# Patient Record
Sex: Female | Born: 1975 | Race: Black or African American | Hispanic: No | Marital: Married | Smoking: Former smoker
Health system: Southern US, Community
[De-identification: ages and names within clinical notes are randomized; demographics above are authoritative.]

## PROBLEM LIST (undated history)

## (undated) ENCOUNTER — Inpatient Hospital Stay (HOSPITAL_COMMUNITY): Payer: Self-pay

## (undated) DIAGNOSIS — G43909 Migraine, unspecified, not intractable, without status migrainosus: Secondary | ICD-10-CM

## (undated) DIAGNOSIS — R519 Headache, unspecified: Secondary | ICD-10-CM

## (undated) DIAGNOSIS — O351XX0 Maternal care for (suspected) chromosomal abnormality in fetus, not applicable or unspecified: Secondary | ICD-10-CM

## (undated) DIAGNOSIS — J309 Allergic rhinitis, unspecified: Secondary | ICD-10-CM

## (undated) DIAGNOSIS — R079 Chest pain, unspecified: Secondary | ICD-10-CM

## (undated) DIAGNOSIS — R51 Headache: Secondary | ICD-10-CM

## (undated) DIAGNOSIS — Z3A18 18 weeks gestation of pregnancy: Secondary | ICD-10-CM

## (undated) DIAGNOSIS — R002 Palpitations: Secondary | ICD-10-CM

## (undated) DIAGNOSIS — Z8619 Personal history of other infectious and parasitic diseases: Secondary | ICD-10-CM

## (undated) HISTORY — DX: Headache, unspecified: R51.9

## (undated) HISTORY — DX: Migraine, unspecified, not intractable, without status migrainosus: G43.909

## (undated) HISTORY — DX: Palpitations: R00.2

## (undated) HISTORY — DX: Headache: R51

## (undated) HISTORY — DX: Chest pain, unspecified: R07.9

## (undated) HISTORY — PX: BUNIONECTOMY: SHX129

## (undated) HISTORY — DX: Maternal care for (suspected) chromosomal abnormality in fetus, not applicable or unspecified: O35.1XX0

## (undated) HISTORY — DX: Allergic rhinitis, unspecified: J30.9

## (undated) HISTORY — DX: Personal history of other infectious and parasitic diseases: Z86.19

## (undated) HISTORY — PX: FACIAL RECONSTRUCTION SURGERY: SHX631

## (undated) HISTORY — DX: 18 weeks gestation of pregnancy: Z3A.18

---

## 2003-04-02 ENCOUNTER — Encounter: Admission: RE | Admit: 2003-04-02 | Discharge: 2003-04-02 | Payer: Self-pay | Admitting: Family Medicine

## 2003-04-02 ENCOUNTER — Encounter: Payer: Self-pay | Admitting: Family Medicine

## 2004-02-03 ENCOUNTER — Other Ambulatory Visit: Admission: RE | Admit: 2004-02-03 | Discharge: 2004-02-03 | Payer: Self-pay | Admitting: Family Medicine

## 2004-07-27 ENCOUNTER — Other Ambulatory Visit: Admission: RE | Admit: 2004-07-27 | Discharge: 2004-07-27 | Payer: Self-pay | Admitting: Obstetrics and Gynecology

## 2005-02-09 ENCOUNTER — Other Ambulatory Visit: Admission: RE | Admit: 2005-02-09 | Discharge: 2005-02-09 | Payer: Self-pay | Admitting: Family Medicine

## 2006-02-09 ENCOUNTER — Other Ambulatory Visit: Admission: RE | Admit: 2006-02-09 | Discharge: 2006-02-09 | Payer: Self-pay | Admitting: Family Medicine

## 2007-02-19 ENCOUNTER — Other Ambulatory Visit: Admission: RE | Admit: 2007-02-19 | Discharge: 2007-02-19 | Payer: Self-pay | Admitting: Family Medicine

## 2008-03-18 ENCOUNTER — Other Ambulatory Visit: Admission: RE | Admit: 2008-03-18 | Discharge: 2008-03-18 | Payer: Self-pay | Admitting: Family Medicine

## 2008-06-10 ENCOUNTER — Encounter: Admission: RE | Admit: 2008-06-10 | Discharge: 2008-06-10 | Payer: Self-pay | Admitting: Family Medicine

## 2008-06-10 IMAGING — CT CT HEAD W/O CM
1 series · 16 of 28 positions shown, 20 images · non-contrast
Comparison: None.

CLINICAL DATA: Headaches and nose bleeds.

CT HEAD WITHOUT CONTRAST
TECHNIQUE: Contiguous axial images were obtained from the base of
the skull through the vertex without contrast.

[Series 32: 3d filtered head · axial · 0.49mm/px · z∈[+20,+147]mm · 16 of 28 slices shown, 20 images]
[im 2/28  brain]
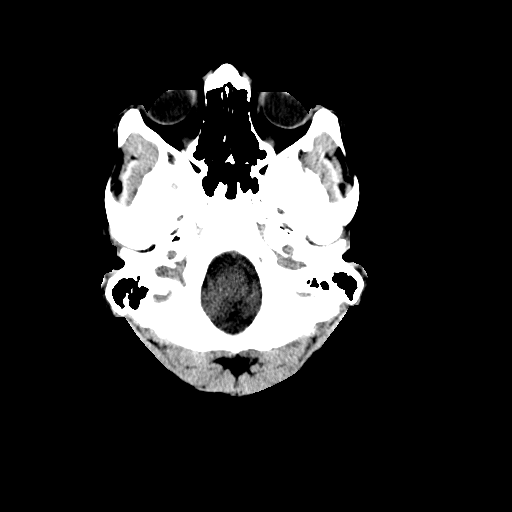
[im 2/28  bone]
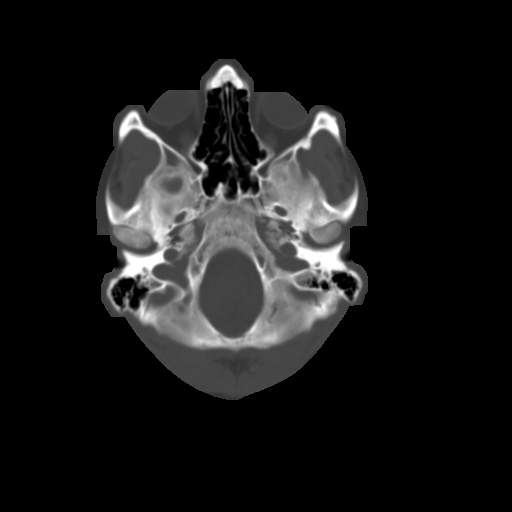
[im 4/28  brain]
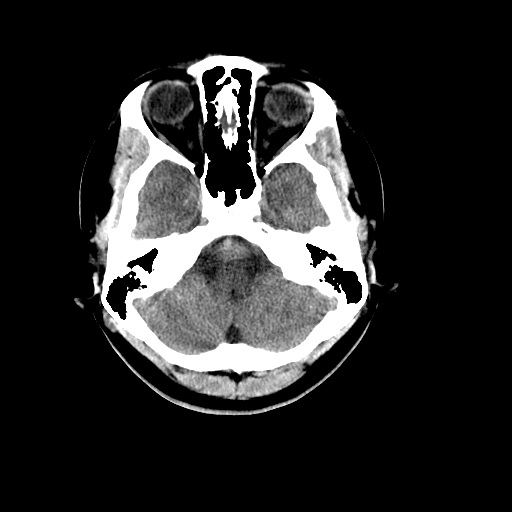
[im 6/28  brain]
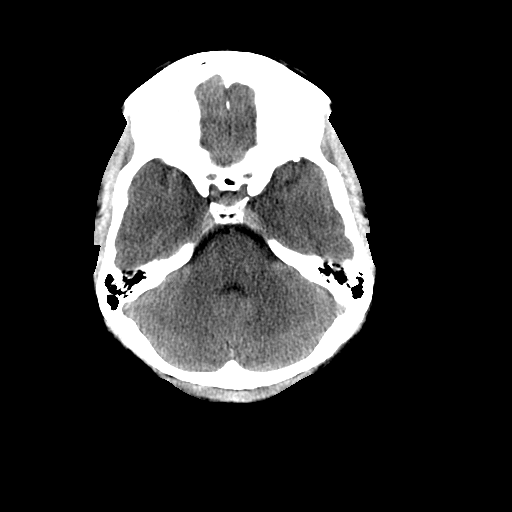
[im 7/28  brain]
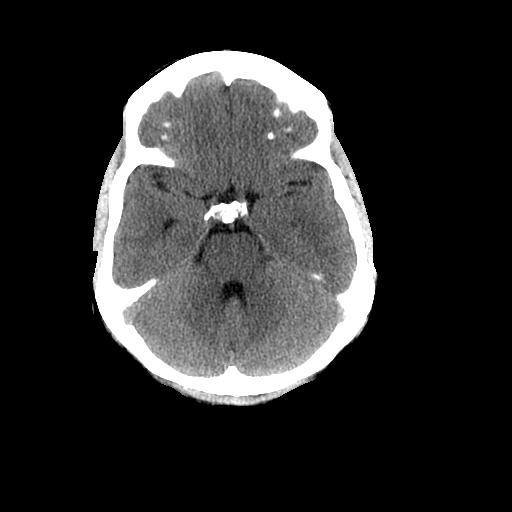
[im 9/28  brain]
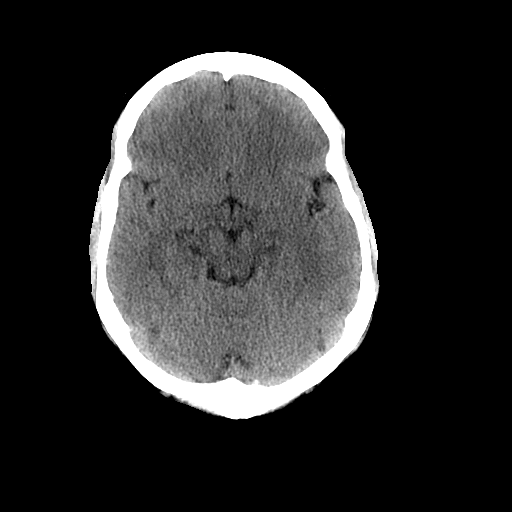
[im 9/28  bone]
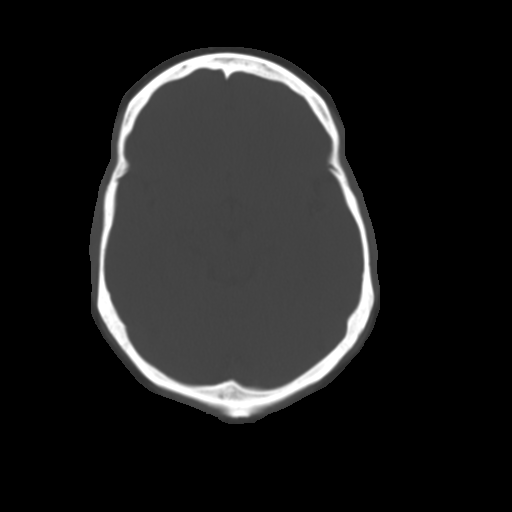
[im 10/28  brain]
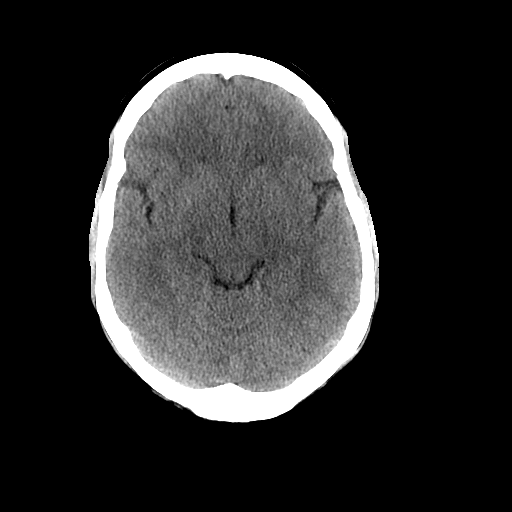
[im 12/28  brain]
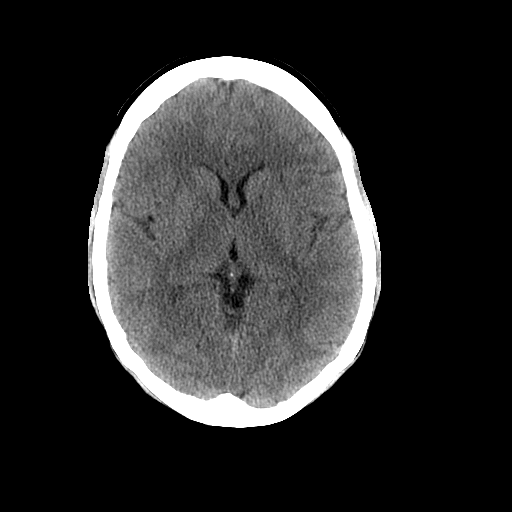
[im 14/28  brain]
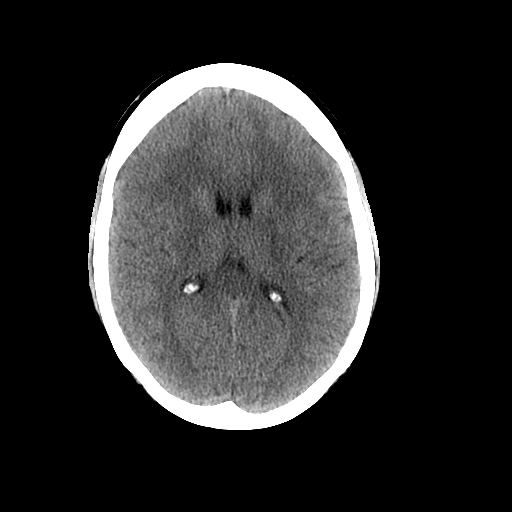
[im 15/28  brain]
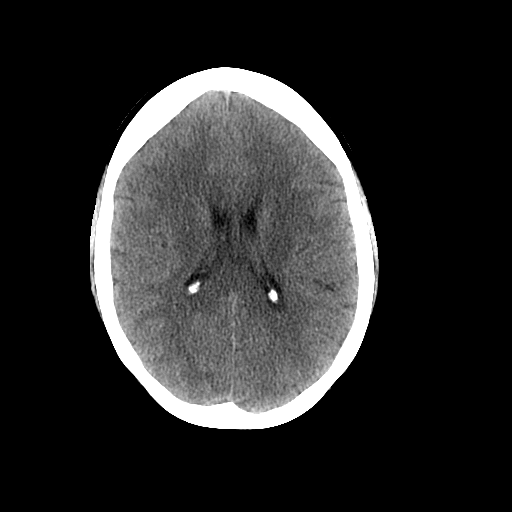
[im 15/28  bone]
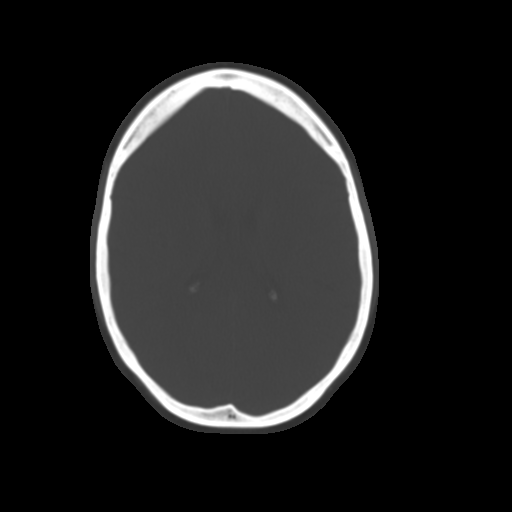
[im 17/28  brain]
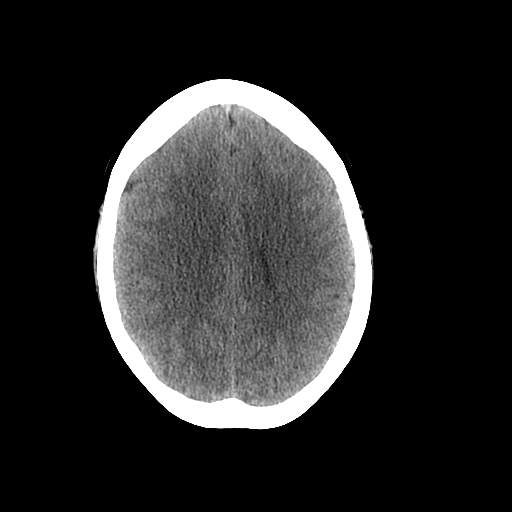
[im 19/28  brain]
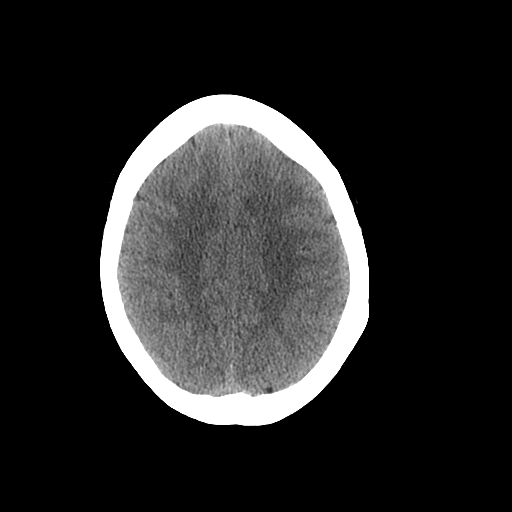
[im 20/28  brain]
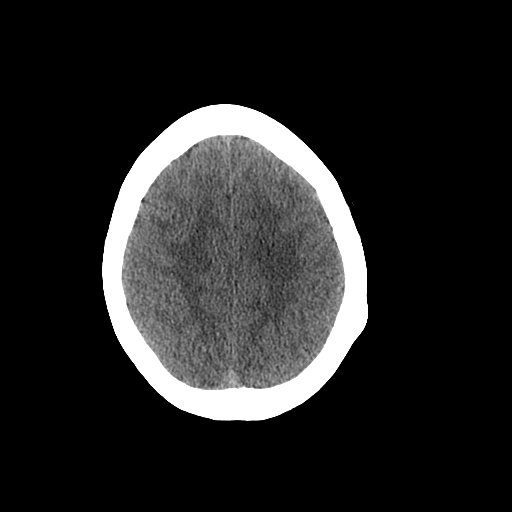
[im 22/28  brain]
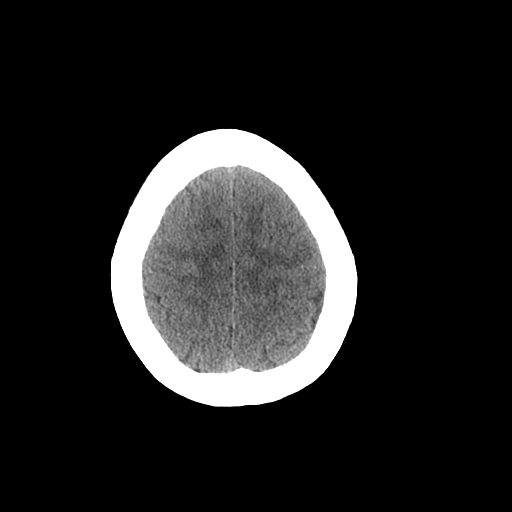
[im 22/28  bone]
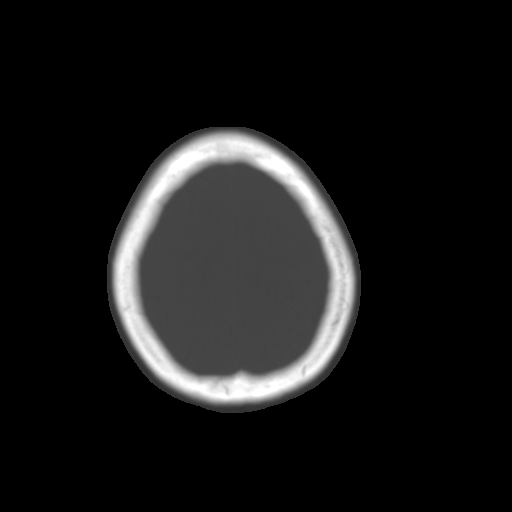
[im 23/28  brain]
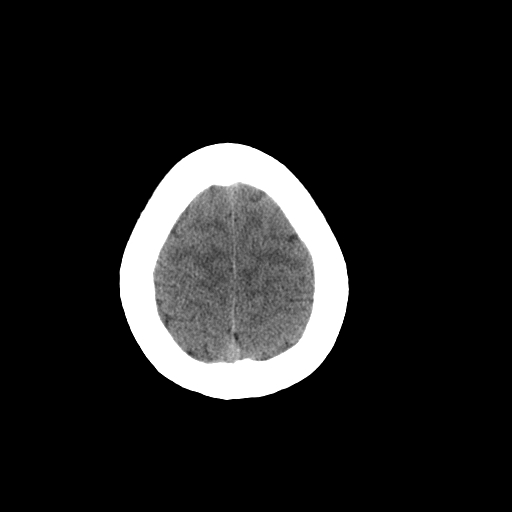
[im 25/28  brain]
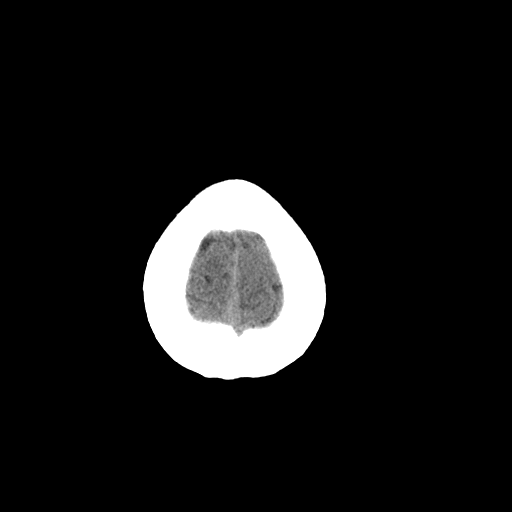
[im 27/28  brain]
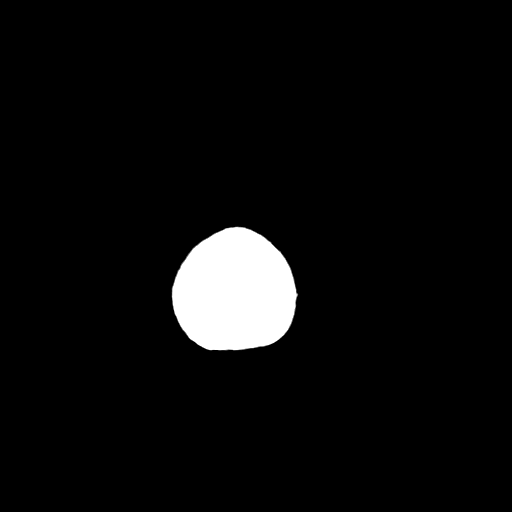

[16 of 28 positions shown; findings below may reference images not displayed]

FINDINGS: There is no evidence for acute hemorrhage, hydrocephalus,
mass lesion, or abnormal extra-axial fluid collection.  No definite
CT evidence of acute infarct.

Bone windows show the visualized portions of the paranasal sinuses
to be clear.
IMPRESSION: No acute intracranial abnormality.

## 2009-04-07 ENCOUNTER — Other Ambulatory Visit: Admission: RE | Admit: 2009-04-07 | Discharge: 2009-04-07 | Payer: Self-pay | Admitting: Family Medicine

## 2009-07-21 ENCOUNTER — Other Ambulatory Visit: Admission: RE | Admit: 2009-07-21 | Discharge: 2009-07-21 | Payer: Self-pay | Admitting: Family Medicine

## 2009-07-21 ENCOUNTER — Encounter: Admission: RE | Admit: 2009-07-21 | Discharge: 2009-07-21 | Payer: Self-pay | Admitting: Diagnostic Neuroimaging

## 2009-07-21 IMAGING — CT CT ANGIO HEAD
4 of 9 series · 18 of 47 positions shown · IV contrast ([ID] OMNI 350)
Comparison: [DATE].

CLINICAL DATA: Right occipital headaches.  Dizziness and nausea.
History migraines.  Occipital neuralgia.  Left-sided ptosis.  Right
arm parasthesias.

CT ANGIOGRAPHY HEAD
TECHNIQUE: Multidetector CT imaging of the head was performed
using the standard protocol during bolus administration of
intravenous contrast. Multiplanar CT image reconstructions
including MIPs were obtained to evaluate the vascular anatomy.
Contrast: 100 ml Omnipaque 350

[Series 6: cta head · axial · 0.35mm/px · z∈[-0,+87]mm · 4 of 59 slices shown]
[im 12/59  brain]
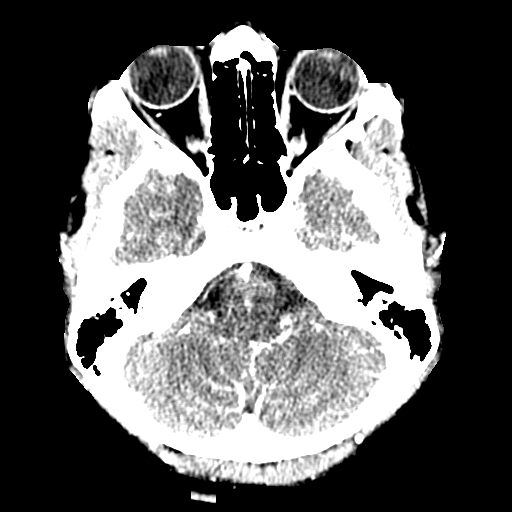
[im 24/59  bone]
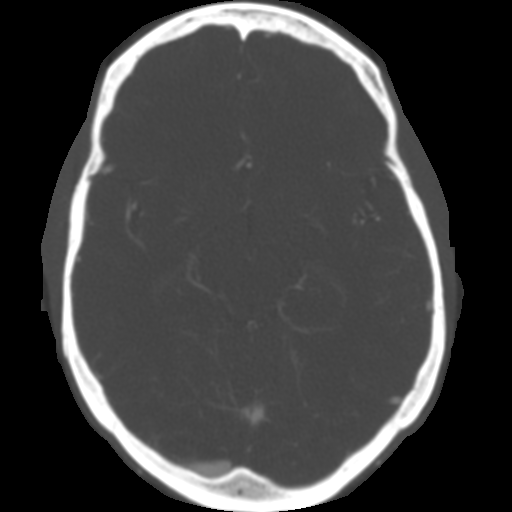
[im 35/59  brain]
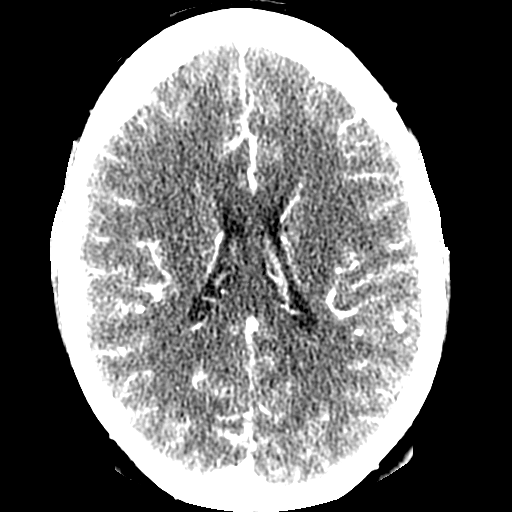
[im 47/59  bone]
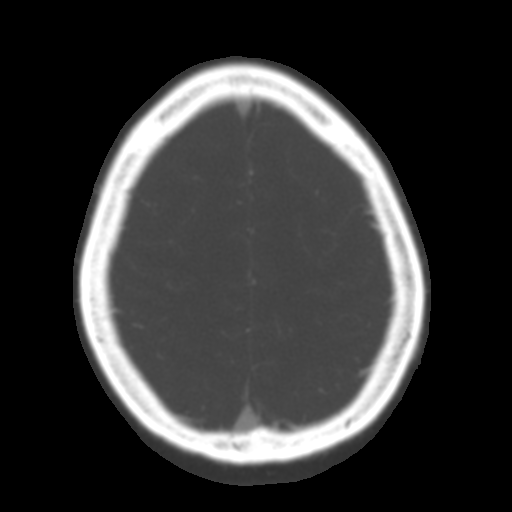

[Series 500: cor angio · coronal · 0.35mm/px · 3 of 167 slices shown]
[im 48/167  brain]
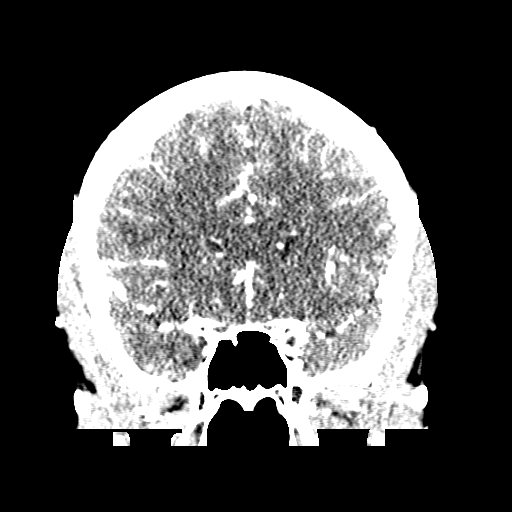
[im 72/167  brain]
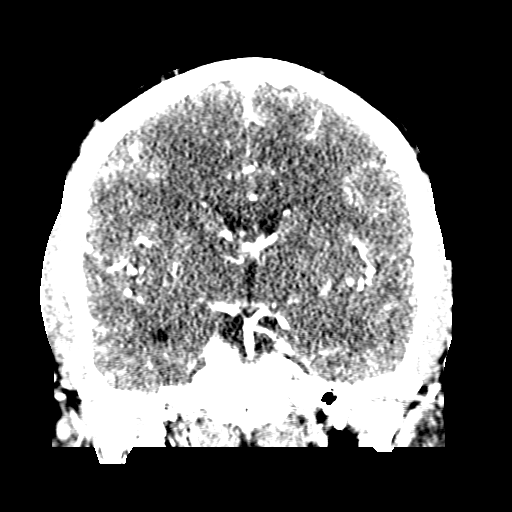
[im 95/167  brain]
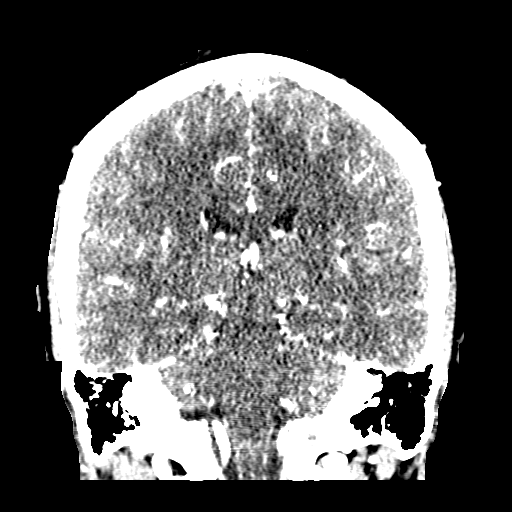

[Series 503: sag · sagittal · 0.35mm/px · 3 of 142 slices shown]
[im 29/142  brain]
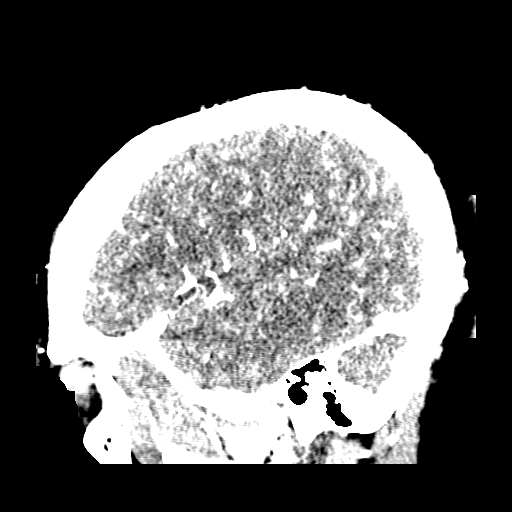
[im 57/142  brain]
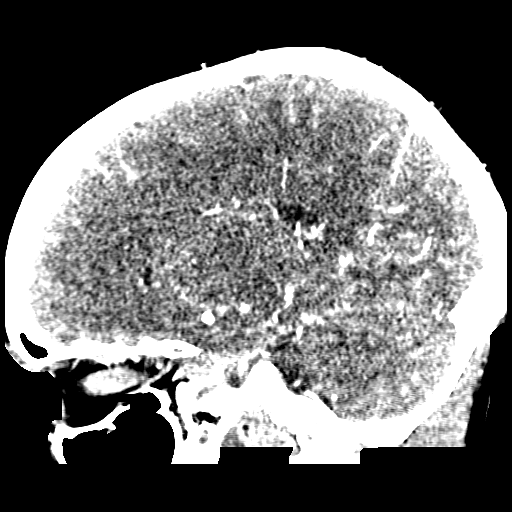
[im 85/142  brain]
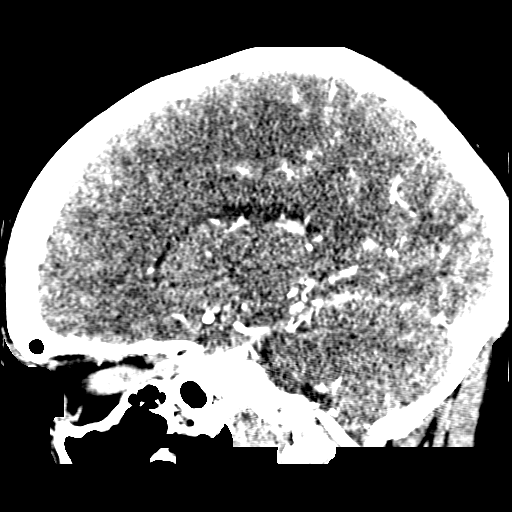

[Series 504: axial · axial · 0.35mm/px · z∈[+41,+146]mm · 8 of 146 slices shown]
[im 11/146  brain]
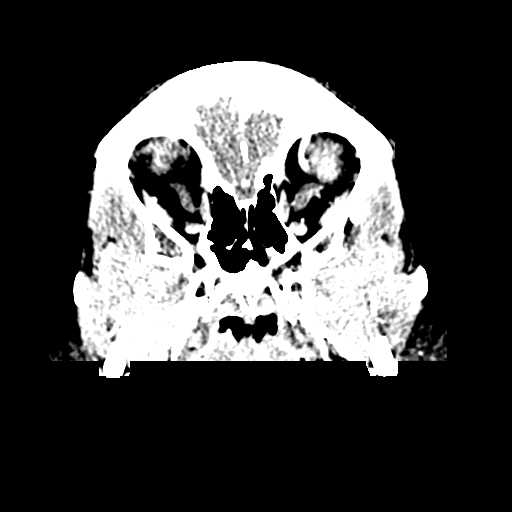
[im 32/146  brain]
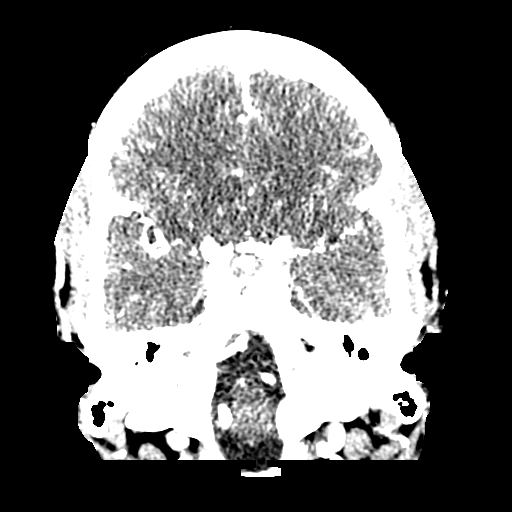
[im 52/146  brain]
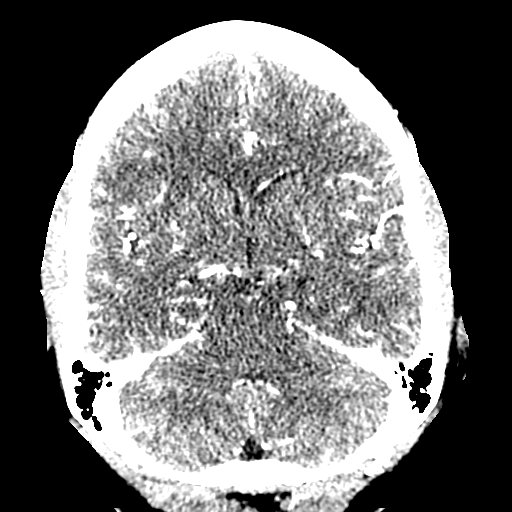
[im 63/146  brain]
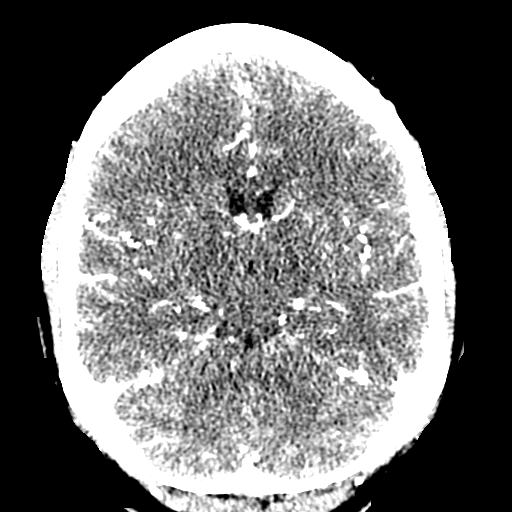
[im 83/146  brain]
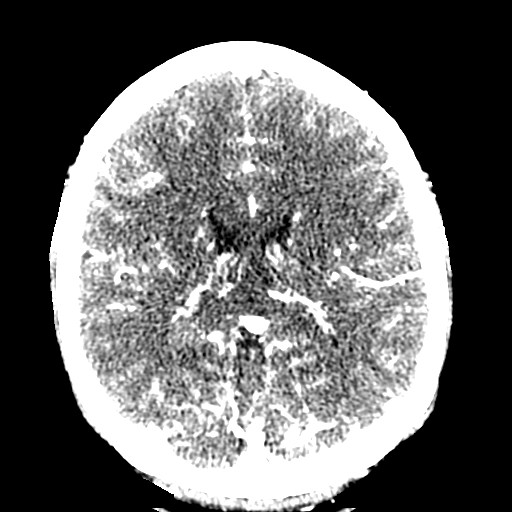
[im 94/146  brain]
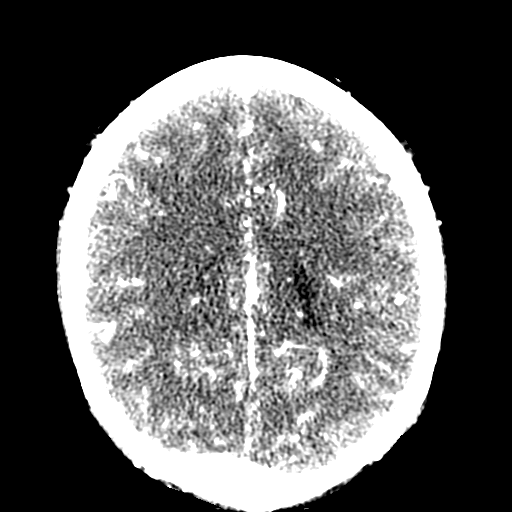
[im 114/146  brain]
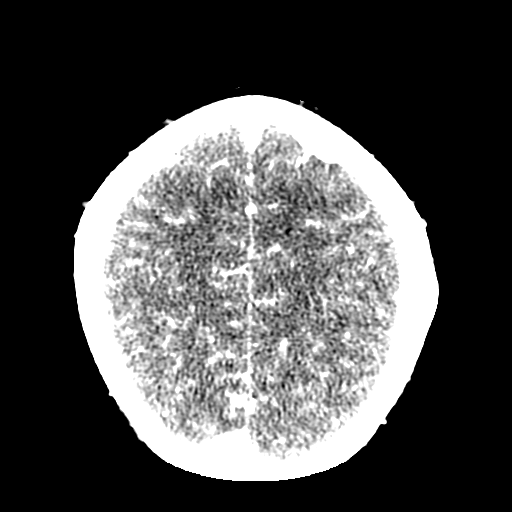
[im 135/146  brain]
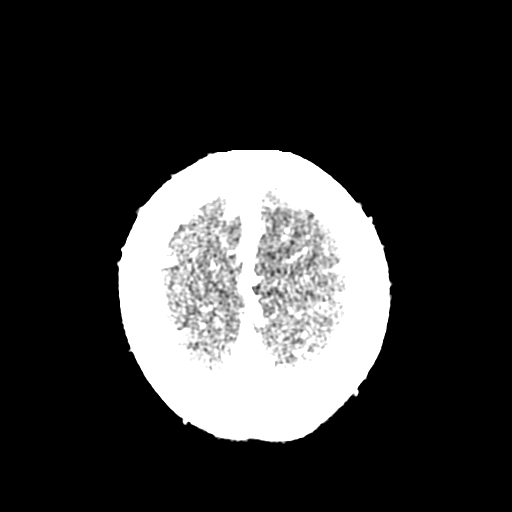

[18 of 47 positions shown; findings below may reference images not displayed]

FINDINGS: Noncontrast imaging of the brain is unremarkable.  There
is no evidence for acute infarct, hemorrhage, mass, hydrocephalus,
or significant extra-axial fluid collection.

The postcontrast images demonstrate no areas pathologic
enhancement.

The paranasal sinuses and mastoid air cells are clear.  The osseous
skull is intact.  The patient is status post ORIF of the left
lateral orbital rim.

CTA images demonstrate normal appearance of the high cervical
internal carotid artery as well as the internal carotid arteries
through the skull base bilaterally.  The A1 and M1 segments are
normal.  The anterior communicating artery is patent.  The MCA
branch vessels are within normal limits bilaterally.

The vertebral arteries are codominant.  The basilar arteries within
normal limits.  Both posterior cerebral arteries originate from the
basilar tip.  Prominent posterior communicating arteries are seen
bilaterally, more prominent on the right.

The dural sinuses are patent.

 Review of the MIP images confirms the above findings.
IMPRESSION: 1.  Normal variant CTA circle of Willis without significant
proximal stenosis, aneurysm, or branch vessel occlusion.
2.  No acute intracranial abnormality.

## 2010-04-08 ENCOUNTER — Other Ambulatory Visit: Admission: RE | Admit: 2010-04-08 | Discharge: 2010-04-08 | Payer: Self-pay | Admitting: Family Medicine

## 2011-04-13 ENCOUNTER — Other Ambulatory Visit (HOSPITAL_COMMUNITY)
Admission: RE | Admit: 2011-04-13 | Discharge: 2011-04-13 | Disposition: A | Discharge status: Home / Self Care | Payer: BC Managed Care – PPO | Source: Ambulatory Visit | Attending: Family Medicine | Admitting: Family Medicine

## 2011-04-13 ENCOUNTER — Other Ambulatory Visit: Payer: Self-pay | Admitting: Family Medicine

## 2011-04-13 DIAGNOSIS — Z124 Encounter for screening for malignant neoplasm of cervix: Secondary | ICD-10-CM | POA: Insufficient documentation

## 2014-06-25 ENCOUNTER — Other Ambulatory Visit: Payer: Self-pay | Admitting: Family Medicine

## 2014-06-25 ENCOUNTER — Other Ambulatory Visit (HOSPITAL_COMMUNITY)
Admission: RE | Admit: 2014-06-25 | Discharge: 2014-06-25 | Disposition: A | Discharge status: Home / Self Care | Payer: BC Managed Care – PPO | Source: Ambulatory Visit | Attending: Family Medicine | Admitting: Family Medicine

## 2014-06-25 DIAGNOSIS — Z124 Encounter for screening for malignant neoplasm of cervix: Secondary | ICD-10-CM | POA: Insufficient documentation

## 2014-06-26 LAB — CYTOLOGY - PAP

## 2015-12-01 ENCOUNTER — Other Ambulatory Visit (HOSPITAL_COMMUNITY): Payer: Self-pay | Admitting: Obstetrics & Gynecology

## 2015-12-01 DIAGNOSIS — Z3189 Encounter for other procreative management: Secondary | ICD-10-CM

## 2015-12-02 DIAGNOSIS — Z3189 Encounter for other procreative management: Secondary | ICD-10-CM | POA: Diagnosis not present

## 2015-12-10 ENCOUNTER — Ambulatory Visit (HOSPITAL_COMMUNITY)
Admission: RE | Admit: 2015-12-10 | Discharge: 2015-12-10 | Disposition: A | Discharge status: Home / Self Care | Payer: BLUE CROSS/BLUE SHIELD | Source: Ambulatory Visit | Attending: Obstetrics & Gynecology | Admitting: Obstetrics & Gynecology

## 2015-12-10 DIAGNOSIS — Z3141 Encounter for fertility testing: Secondary | ICD-10-CM | POA: Diagnosis not present

## 2015-12-10 DIAGNOSIS — Z3189 Encounter for other procreative management: Secondary | ICD-10-CM | POA: Insufficient documentation

## 2015-12-10 IMAGING — RF DG HYSTEROGRAM
2 series · 2 of 2 positions shown · IV contrast (omnipaque)
Comparison: None

CLINICAL DATA: Encounter for fertility planning

EXAM:
HYSTEROSALPINGOGRAM
TECHNIQUE: Following cleansing of the cervix and vagina with Betadine solution,
a hysterosalpingogram was performed using a 5-French
hysterosalpingogram catheter and Omnipaque 300 contrast. The patient
tolerated the examination without difficulty.

[Series 1: run · 1 of 1 slices shown (1 of 2)]
[im 1/1]
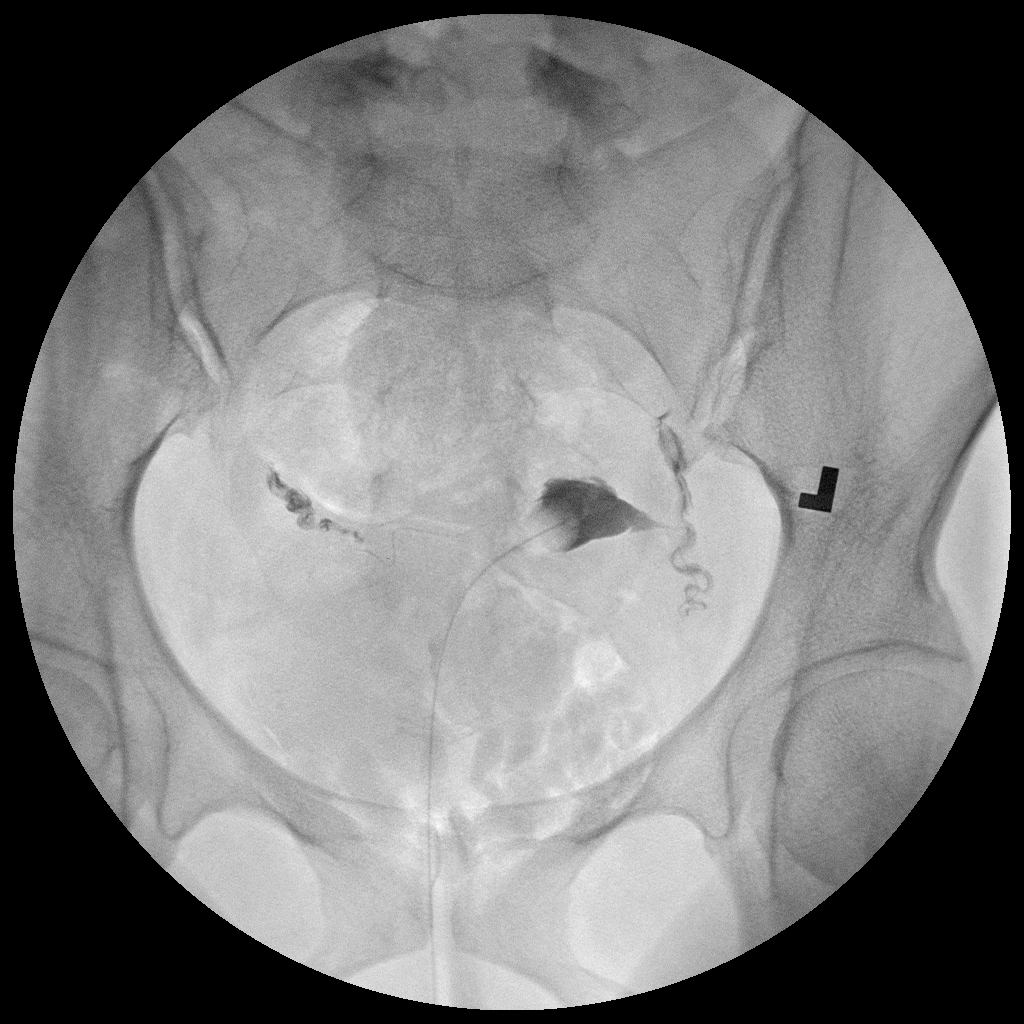

[Series 2: run · 1 of 1 slices shown (2 of 2)]
[im 1/1]
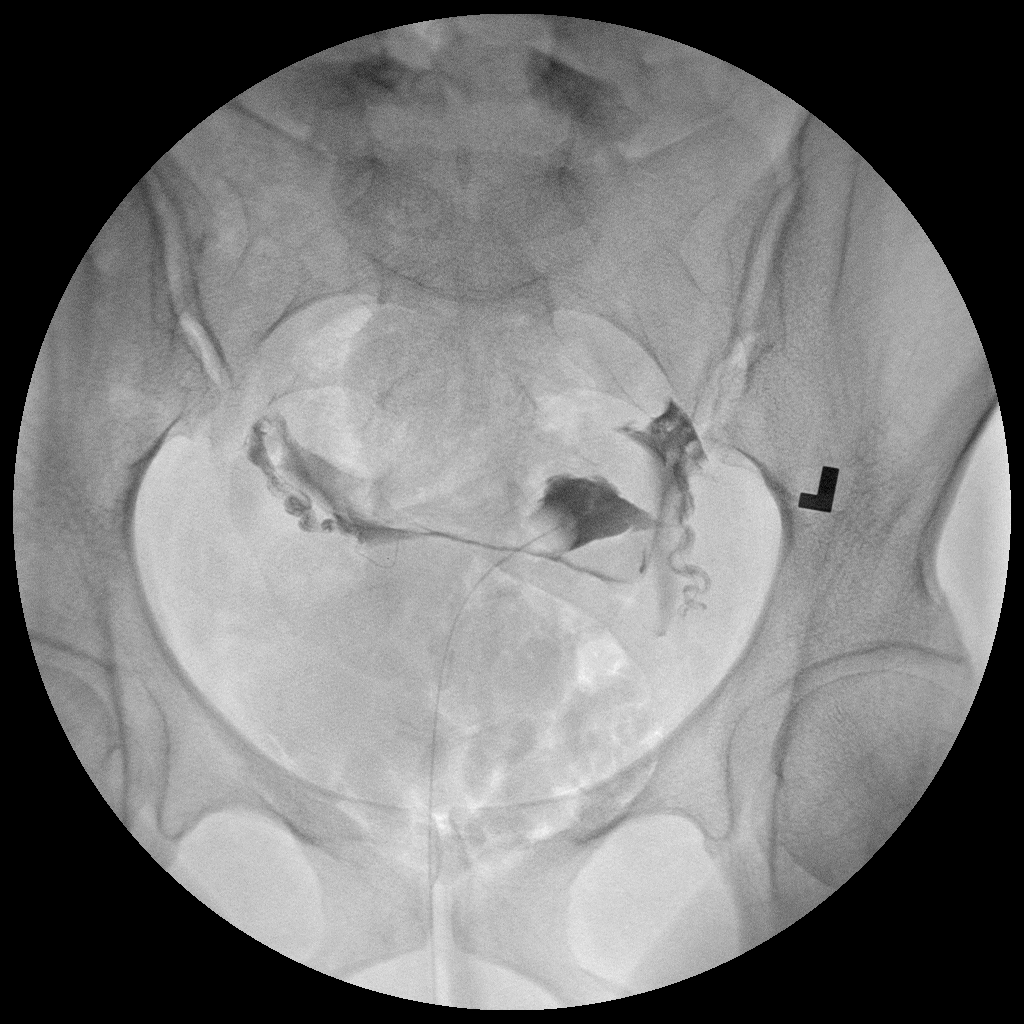

[2 of 2 positions shown; findings below may reference images not displayed]

FLUOROSCOPY TIME:  Radiation Exposure Index (as provided by the
fluoroscopic device):

If the device does not provide the exposure index:

Fluoroscopy Time:  12 seconds

Number of Acquired Images:  2
FINDINGS: The endometrial cavity is normal in appearance and contour. No signs
of mullerian duct anomaly.

Opacification of both fallopian tubes is seen. Both tubes appear
normal. Intraperitoneal spill of contrast from both fallopian tubes
is demonstrated.
IMPRESSION: Normal study. Both fallopian tubes are patent.

## 2015-12-10 MED ORDER — IOPAMIDOL (ISOVUE-300) INJECTION 61%
30.0000 mL | Freq: Once | INTRAVENOUS | Status: AC | PRN
  Administered 2015-12-10: 3 mL

## 2016-01-18 DIAGNOSIS — H524 Presbyopia: Secondary | ICD-10-CM | POA: Diagnosis not present

## 2016-01-18 DIAGNOSIS — H52223 Regular astigmatism, bilateral: Secondary | ICD-10-CM | POA: Diagnosis not present

## 2016-01-18 DIAGNOSIS — H5213 Myopia, bilateral: Secondary | ICD-10-CM | POA: Diagnosis not present

## 2016-04-21 ENCOUNTER — Telehealth: Payer: Self-pay | Admitting: Cardiology

## 2016-04-21 ENCOUNTER — Encounter: Payer: Self-pay | Admitting: Nurse Practitioner

## 2016-04-21 NOTE — Telephone Encounter (Signed)
Closed encounter °

## 2016-04-25 ENCOUNTER — Ambulatory Visit: Payer: BLUE CROSS/BLUE SHIELD | Admitting: Nurse Practitioner

## 2016-04-25 ENCOUNTER — Telehealth: Payer: Self-pay | Admitting: Cardiology

## 2016-04-25 NOTE — Telephone Encounter (Signed)
Received records from [facility] for appointment on 05/05/16 with Dr Herbie BaltimoreHarding.  [facility] Dr Elissa HeftyHarding's schedule on 05/05/16. lp

## 2016-05-05 ENCOUNTER — Encounter: Payer: Self-pay | Admitting: Cardiology

## 2016-05-05 ENCOUNTER — Encounter (INDEPENDENT_AMBULATORY_CARE_PROVIDER_SITE_OTHER): Payer: BLUE CROSS/BLUE SHIELD

## 2016-05-05 ENCOUNTER — Ambulatory Visit (INDEPENDENT_AMBULATORY_CARE_PROVIDER_SITE_OTHER): Payer: BLUE CROSS/BLUE SHIELD | Admitting: Cardiology

## 2016-05-05 VITALS — BP 106/88 | HR 99 | Ht 63.0 in | Wt 158.0 lb

## 2016-05-05 DIAGNOSIS — R002 Palpitations: Secondary | ICD-10-CM

## 2016-05-05 DIAGNOSIS — R079 Chest pain, unspecified: Secondary | ICD-10-CM

## 2016-05-05 NOTE — Progress Notes (Signed)
PCP: Hollice Espy, MD  Clinic Note: Chief Complaint  Patient presents with  . Follow-up    New patient.  . Chest Pain    Tightness  . Edema    Ankles when standing for lond periods of time.    HPI: Stephanie Watts is a 40 y.o. female with a PMH below who presents today for Evaluation of chest discomfort and palpitations.Stephanie Watts was referred by her primary care provider, Dr. Shaune Pollack. She was last seen in February 2017 and was noticing having intermittent episodes of fleeting pinching sensation and left side of her chest. At that time this was not thought to be anything significant, but since the symptoms have persisted, she is now referred for evaluation. She really has no significant medical history other than migraine headaches and allergy rhinitis. As per family history her paternal grandfather may have had CHF and both parents had hypertension. Otherwise no significant CAD history.  Recent Hospitalizations: None  Studies Reviewed: None  Interval History: [name] is a otherwise healthy young woman who is here today with her husband (a Optometrist).  She tries to stay active and is working on trying to become more consistent with her gym activities. She is low to concerned about this chest discomfort episodes in soap and to have some peace of mind. Basically what she describes are episodes lasting anywhere from a minute or 2 up to 15 minutes of just an unusual pinching sensation in the left upper chest just lateral to the sternal border. It's somewhat focal in nature, and really does not have a true affiliation with any particular activity. He can happen when she is sitting or standing, tends to not be as much when she is doing her activities, But it can be with exercise. She doesn't know if it feels like a flip-flopping in her chest or if it's a pinching sensation. It is not described as a pressure or tightness however. She doesn't notice any shortness of  breath associated with it, but does occasionally say she may feel limited dizzy. Usually the dizziness happens more frequently when she does her exercise without having either had enough to eat or drink. Despite having these episodes, she's not had any exertional dyspnea. No heart failure symptoms of PND, orthopnea or edema. No rapid irregular heartbeat sensations to suggest an arrhythmia.No syncope or near-syncope, TIA or amaurosis fugax symptoms.  ROS: A comprehensive was performed. Review of Systems  Constitutional: Negative for chills and malaise/fatigue.  HENT: Negative for nosebleeds.   Eyes: Negative for blurred vision.  Respiratory: Negative for cough, shortness of breath and wheezing.   Cardiovascular: Positive for chest pain and palpitations.  Gastrointestinal: Negative for blood in stool and melena.  Genitourinary: Negative for hematuria.  Musculoskeletal: Negative.   Neurological: Positive for dizziness (Per history of present illness). Negative for tingling, focal weakness, loss of consciousness and headaches.  Endo/Heme/Allergies: Positive for environmental allergies.  Psychiatric/Behavioral: Negative for depression and memory loss. The patient is not nervous/anxious and does not have insomnia.     Past Medical History:  Diagnosis Date  . Allergic rhinitis   . Hx of herpes genitalis   . Migraine headache   . Occipital headache     Past Surgical History:  Procedure Laterality Date  . NO PAST SURGERIES     Prior to Admission medications   Medication Sig Start Date End Date Taking? Authorizing Provider  Multiple Vitamins-Minerals (CENTRUM PO) Take by mouth daily.  Yes Historical Provider, MD  Prenatal Multivit-Min-Fe-FA (PRENATAL VITAMINS PO) Take 1 tablet by mouth daily.   Yes Historical Provider, MD  valACYclovir (VALTREX) 500 MG tablet Take 500 mg by mouth as needed. 04/18/16  Yes Historical Provider, MD   No Known Allergies  Social History   Social History  .  Marital status: Married    Spouse name: N/A  . Number of children: N/A  . Years of education: N/A   Social History Main Topics  . Smoking status: Former Smoker    Types: Cigarettes  . Smokeless tobacco: Never Used     Comment: every 6 mths or so  . Alcohol use None  . Drug use: No  . Sexual activity: Yes   Other Topics Concern  . None   Social History Narrative  . None    Family History  Problem Relation Age of Onset  . Hypertension Mother   . High Cholesterol Father   . Hypertension Father   . Healthy Sister   . Healthy Brother   . Congestive Heart Failure Paternal Grandfather     Wt Readings from Last 3 Encounters:  05/05/16 158 lb (71.7 kg)    PHYSICAL EXAM BP 106/88   Pulse 99   Ht 5\' 3"  (1.6 m)   Wt 158 lb (71.7 kg)   BMI 27.99 kg/m  General appearance: alert, cooperative, appears stated age, no distress and Well-nourished/well-groomed HEENT: [place]/AT, EOMI, MMM, anicteric sclera Neck: no adenopathy, no carotid bruit and no JVD Lungs: clear to auscultation bilaterally, normal percussion bilaterally and non-labored Heart: regular rate and rhythm, S1 & S2 normal, no murmur, click, rub or gallop; nondisplaced PMI. Unable to reproduce her symptoms, but able to locate with one finger. Abdomen: soft, non-tender; bowel sounds normal; no masses,  no organomegaly;  Extremities: extremities normal, atraumatic, no cyanosis, Or edema  Pulses: 2+ and symmetric;  Skin: mobility and turgor normal, no edema, no evidence of bleeding or bruising and no lesions noted Neurologic: Mental status: Alert, oriented, thought content appropriate Cranial nerves: normal (II-XII grossly intact)    Adult ECG Report  Rate: 99 ;  Rhythm: normal sinus rhythm and Normal axis, intervals and durations.;   Narrative Interpretation: Normal EKG   Other studies Reviewed: Additional studies/ records that were reviewed today include:  Recent Labs:  None available     ASSESSMENT /  PLAN: Problem List Items Addressed This Visit    Palpitations    She can't tell if these episodes are palpitations or discomfort. I heard some ectopy on exam, and 1 she may be having some episodes of bigeminy or some other type of palpitations that could be causing her discomfort in her chest. Plan: Short Term Event Monitor      Relevant Orders   EKG 12-Lead   EXERCISE TOLERANCE TEST   Cardiac event monitor   Chest pain with low risk for cardiac etiology - Primary    Focal pain unless the chest dated does not seem to be classically anginal symptom. She really does not have much the way of any significant risk factors, but is somewhat concerned about getting more involved in an excised regimen. Plan: GXT      Relevant Orders   EKG 12-Lead   EXERCISE TOLERANCE TEST   Cardiac event monitor    Other Visit Diagnoses   None.     Current medicines are reviewed at length with the patient today. (+/- concerns) None The following changes have been made: None Studies Ordered:  Orders Placed This Encounter  Procedures  . EXERCISE TOLERANCE TEST  . Cardiac event monitor  . EKG 12-Lead   Follow-up in one month   Bryan Lemmaavid Harding, M.D., M.S. Interventional Cardiologist   Pager # [phone] Phone # [phone] [address] [place], [place] [address]

## 2016-05-05 NOTE — Patient Instructions (Signed)
SCHEDULE [address] Your physician has requested that you have an exercise tolerance test. For further information please visit [email or web address] Please also follow instruction sheet, as given.   Your physician has recommended that you wear an event monitor FOR 1 WEEK. Event monitors are medical devices that record the heart's electrical activity. Doctors most often us these monitors to diagnose arrhythmias. Arrhythmias are problems with the speed or rhythm of the heartbeat. The monitor is a small, portable device. You can wear one while you do your normal daily activities. This is usually used to diagnose what is causing palpitations/syncope (passing out).   Your physician recommends that you schedule a follow-up appointment in: 1 MONTH WITH DR HARDING.

## 2016-05-06 ENCOUNTER — Encounter: Payer: Self-pay | Admitting: Cardiology

## 2016-05-06 DIAGNOSIS — R002 Palpitations: Secondary | ICD-10-CM | POA: Insufficient documentation

## 2016-05-06 DIAGNOSIS — R071 Chest pain on breathing: Secondary | ICD-10-CM

## 2016-05-06 DIAGNOSIS — R079 Chest pain, unspecified: Secondary | ICD-10-CM

## 2016-05-06 DIAGNOSIS — R0789 Other chest pain: Secondary | ICD-10-CM | POA: Insufficient documentation

## 2016-05-06 HISTORY — DX: Chest pain, unspecified: R07.9

## 2016-05-06 HISTORY — DX: Palpitations: R00.2

## 2016-05-06 NOTE — Assessment & Plan Note (Signed)
She can't tell if these episodes are palpitations or discomfort. I heard some ectopy on exam, and 1 she may be having some episodes of bigeminy or some other type of palpitations that could be causing her discomfort in her chest. Plan: Short Term Event Monitor

## 2016-05-06 NOTE — Assessment & Plan Note (Signed)
Focal pain unless the chest dated does not seem to be classically anginal symptom. She really does not have much the way of any significant risk factors, but is somewhat concerned about getting more involved in an excised regimen. Plan: GXT

## 2016-05-09 DIAGNOSIS — Z1231 Encounter for screening mammogram for malignant neoplasm of breast: Secondary | ICD-10-CM | POA: Diagnosis not present

## 2016-05-09 DIAGNOSIS — Z319 Encounter for procreative management, unspecified: Secondary | ICD-10-CM | POA: Diagnosis not present

## 2016-05-09 DIAGNOSIS — Z31430 Encounter of female for testing for genetic disease carrier status for procreative management: Secondary | ICD-10-CM | POA: Diagnosis not present

## 2016-05-11 DIAGNOSIS — R002 Palpitations: Secondary | ICD-10-CM

## 2016-05-12 ENCOUNTER — Telehealth (HOSPITAL_COMMUNITY): Payer: Self-pay

## 2016-05-12 NOTE — Telephone Encounter (Signed)
Encounter complete. 

## 2016-05-17 ENCOUNTER — Ambulatory Visit (HOSPITAL_COMMUNITY)
Admission: RE | Admit: 2016-05-17 | Discharge: 2016-05-17 | Disposition: A | Discharge status: Home / Self Care | Payer: BLUE CROSS/BLUE SHIELD | Source: Ambulatory Visit | Attending: Nurse Practitioner | Admitting: Nurse Practitioner

## 2016-05-17 DIAGNOSIS — R079 Chest pain, unspecified: Secondary | ICD-10-CM | POA: Insufficient documentation

## 2016-05-17 DIAGNOSIS — R002 Palpitations: Secondary | ICD-10-CM | POA: Diagnosis not present

## 2016-05-17 LAB — EXERCISE TOLERANCE TEST
Estimated workload: 10.1 METS
Exercise duration (min): 9 min
Exercise duration (sec): 0 s
MPHR: 180 {beats}/min
Peak HR: 181 {beats}/min
Percent HR: 100 %
RPE: 17
Rest HR: 92 {beats}/min

## 2016-05-30 NOTE — Addendum Note (Signed)
Addended by: Neta EhlersRUITT, ANGELA M on: 05/30/2016 02:40 PM   Modules accepted: Orders

## 2016-06-06 ENCOUNTER — Ambulatory Visit (INDEPENDENT_AMBULATORY_CARE_PROVIDER_SITE_OTHER): Payer: BLUE CROSS/BLUE SHIELD | Admitting: Cardiology

## 2016-06-06 ENCOUNTER — Encounter: Payer: Self-pay | Admitting: Cardiology

## 2016-06-06 VITALS — BP 110/78 | HR 92 | Ht 63.0 in | Wt 160.0 lb

## 2016-06-06 DIAGNOSIS — R002 Palpitations: Secondary | ICD-10-CM | POA: Diagnosis not present

## 2016-06-06 DIAGNOSIS — R079 Chest pain, unspecified: Secondary | ICD-10-CM | POA: Diagnosis not present

## 2016-06-06 NOTE — Progress Notes (Signed)
PCP: Hollice Espy, MD  Clinic Note: Chief Complaint  Patient presents with  . Follow-up    chest pain evaluation - Monitor & GXT  . Dizziness    LAST THURSDAY    HPI: Stephanie Watts is a 40 y.o. female with a PMH below who presents today for follow-up Evaluation of chest discomfort and palpitations.Stephanie Watts was referred by her primary care provider, Dr. Shaune Pollack for intermittent episodes of fleeting pinching sensation and left side of her chest. . She really has no significant medical history other than migraine headaches and allergy rhinitis. As per family history her paternal grandfather may have had CHF and both parents had hypertension. Otherwise no significant CAD history. Initially seen in consultation 05/05/2016, GXT ordered along with monitor.  Recent Hospitalizations: None  Studies Reviewed:   GXT: Normal exercise capacity. Normal BP response to exertion. No ischemia.  ETT 04/2016: Blood pressure demonstrated a normal response to exercise.  There was no ST segment deviation noted during stress.   Monitor: SR, Stachy.   1 Stable: Sinus Rhythm, Sinus Tachycardia . Chest Pain or Pressure; Tired or Fatigued, Baseline  16 Stable: Sinus Rhythm . Chest Pain or Pressure   Interval History: Stephanie Watts presents for follow-up after her studies noted above.  She still notes intermittent "pinching" sensation along her Left sternal border -- not necessarily exertional and not associated with dyspnea. Still feels her HR go up, but not associated with dizziness or near syncope -- just "feels funny".  No heart failure symptoms of PND, orthopnea or edema. No irregular heartbeat sensations to suggest an arrhythmia. No syncope or near-syncope, TIA or amaurosis fugax symptoms.  She is very excited to announce that she recently found out she is pregnant. ROS: A comprehensive was performed. Review of Systems  Constitutional: Negative for chills and malaise/fatigue.  HENT:  Negative for nosebleeds.   Eyes: Negative for blurred vision.  Respiratory: Negative for cough, shortness of breath and wheezing.   Cardiovascular: Positive for chest pain and palpitations.  Gastrointestinal: Negative for blood in stool and melena.  Genitourinary: Negative for hematuria.  Musculoskeletal: Negative.   Neurological: Positive for dizziness (Per history of present illness). Negative for tingling, focal weakness, loss of consciousness and headaches.  Endo/Heme/Allergies: Positive for environmental allergies.  Psychiatric/Behavioral: Negative for depression and memory loss. The patient is not nervous/anxious and does not have insomnia.     Past Medical History:  Diagnosis Date  . Allergic rhinitis   . Hx of herpes genitalis   . Migraine headache   . Occipital headache     Past Surgical History:  Procedure Laterality Date  . NO PAST SURGERIES     Prior to Admission medications   Medication Sig Start Date End Date Taking? Authorizing Provider  Multiple Vitamins-Minerals (CENTRUM PO) Take by mouth daily.   Yes Historical Provider, MD  Prenatal Multivit-Min-Fe-FA (PRENATAL VITAMINS PO) Take 1 tablet by mouth daily.   Yes Historical Provider, MD  valACYclovir (VALTREX) 500 MG tablet Take 500 mg by mouth as needed. 04/18/16  Yes Historical Provider, MD   No Known Allergies  Social History   Social History  . Marital status: Married    Spouse name: N/A  . Number of children: N/A  . Years of education: N/A   Social History Main Topics  . Smoking status: Former Smoker    Types: Cigarettes  . Smokeless tobacco: Never Used     Comment: every 6 mths or so  . Alcohol  use None  . Drug use: No  . Sexual activity: Yes   Other Topics Concern  . None   Social History Narrative  . None    Family History  Problem Relation Age of Onset  . Hypertension Mother   . High Cholesterol Father   . Hypertension Father   . Healthy Sister   . Healthy Brother   . Congestive  Heart Failure Paternal Grandfather     Wt Readings from Last 3 Encounters:  06/06/16 72.6 kg (160 lb)  05/05/16 71.7 kg (158 lb)    PHYSICAL EXAM BP 110/78   Pulse 92   Ht 5\' 3"  (1.6 m)   Wt 72.6 kg (160 lb)   BMI 28.34 kg/m  General appearance: alert, cooperative, appears stated age, no distress and Well-nourished/well-groomed HEENT: [name]/AT, EOMI, MMM, anicteric sclera Neck: no adenopathy, no carotid bruit and no JVD Lungs: clear to auscultation bilaterally, normal percussion bilaterally and non-labored Heart: RRR, S1 & S2 normal, no murmur, click, rub or gallop; nondisplaced PMI. Unable to reproduce her symptoms, but able to locate with one finger. Abdomen: soft, non-tender; bowel sounds normal; no masses,  no organomegaly;  Extremities: extremities normal, atraumatic, no cyanosis, Or edema  Pulses: 2+ and symmetric;  Skin: mobility and turgor normal, no edema, no evidence of bleeding or bruising and no lesions noted Neurologic: Mental status: Alert, oriented, thought content appropriate; Grossly normal    Adult ECG Report  Rate: 92 ;  Rhythm: normal sinus rhythm and Normal axis, intervals and durations.;   Narrative Interpretation: Normal EKG   Other studies Reviewed: Additional studies/ records that were reviewed today include:  Recent Labs:  None available     ASSESSMENT / PLAN: Relatively stable from a cardiac standpoint with normal GXT and normal findings on her monitor. No further cardiac evaluation needed. Can follow-up when necessary.  Problem List Items Addressed This Visit    Palpitations - Primary    No significant findings on monitor. Despite noticing symptoms, she was either in sinus rhythm or sinus tachycardia. She does have baseline resting tachycardia, but when I evaluated her her heart rate was much lower than the 106. She likely may have some tachycardia related to her pregnancy as well.      Relevant Orders   EKG 12-Lead   Chest pain with low risk  for cardiac etiology    She did very well on her GXT. No active symptoms while on the treadmill. Low risk finding. Unlikely to be cardiac pain. I think is probably musculoskeletal.       Other Visit Diagnoses   None.     Current medicines are reviewed at length with the patient today. (+/- concerns) None The following changes have been made: None Studies Ordered:   Orders Placed This Encounter  Procedures  . EKG 12-Lead   Follow-up in one month   Bryan Lemmaavid Karren Newland, M.D., M.S. Interventional Cardiologist   Pager # [phone] Phone # [phone] [address] [place], [place] [address]

## 2016-06-06 NOTE — Patient Instructions (Signed)
CONGRATULATIONS    NO CHANGES AT CURRENT TIME    Your physician recommends that you schedule a follow-up appointment AS NEEDED

## 2016-06-07 DIAGNOSIS — Z3200 Encounter for pregnancy test, result unknown: Secondary | ICD-10-CM | POA: Diagnosis not present

## 2016-06-08 ENCOUNTER — Encounter: Payer: Self-pay | Admitting: Cardiology

## 2016-06-08 NOTE — Assessment & Plan Note (Signed)
She did very well on her GXT. No active symptoms while on the treadmill. Low risk finding. Unlikely to be cardiac pain. I think is probably musculoskeletal.

## 2016-06-08 NOTE — Assessment & Plan Note (Signed)
No significant findings on monitor. Despite noticing symptoms, she was either in sinus rhythm or sinus tachycardia. She does have baseline resting tachycardia, but when I evaluated her her heart rate was much lower than the 106. She likely may have some tachycardia related to her pregnancy as well.

## 2016-07-19 DIAGNOSIS — Z23 Encounter for immunization: Secondary | ICD-10-CM | POA: Diagnosis not present

## 2016-07-19 DIAGNOSIS — N912 Amenorrhea, unspecified: Secondary | ICD-10-CM | POA: Diagnosis not present

## 2016-07-19 DIAGNOSIS — Z3201 Encounter for pregnancy test, result positive: Secondary | ICD-10-CM | POA: Diagnosis not present

## 2016-07-19 DIAGNOSIS — Z0000 Encounter for general adult medical examination without abnormal findings: Secondary | ICD-10-CM | POA: Diagnosis not present

## 2016-07-31 DIAGNOSIS — Z3480 Encounter for supervision of other normal pregnancy, unspecified trimester: Secondary | ICD-10-CM | POA: Diagnosis not present

## 2016-08-03 ENCOUNTER — Other Ambulatory Visit (HOSPITAL_COMMUNITY): Payer: Self-pay | Admitting: Obstetrics & Gynecology

## 2016-08-03 DIAGNOSIS — O09522 Supervision of elderly multigravida, second trimester: Secondary | ICD-10-CM | POA: Diagnosis not present

## 2016-08-03 DIAGNOSIS — Z3687 Encounter for antenatal screening for uncertain dates: Secondary | ICD-10-CM

## 2016-08-03 DIAGNOSIS — O3680X0 Pregnancy with inconclusive fetal viability, not applicable or unspecified: Secondary | ICD-10-CM

## 2016-08-07 DIAGNOSIS — O200 Threatened abortion: Secondary | ICD-10-CM | POA: Diagnosis not present

## 2016-08-07 DIAGNOSIS — O26841 Uterine size-date discrepancy, first trimester: Secondary | ICD-10-CM | POA: Diagnosis not present

## 2016-08-07 DIAGNOSIS — O09521 Supervision of elderly multigravida, first trimester: Secondary | ICD-10-CM | POA: Diagnosis not present

## 2016-08-09 DIAGNOSIS — O200 Threatened abortion: Secondary | ICD-10-CM | POA: Diagnosis not present

## 2016-08-16 DIAGNOSIS — N911 Secondary amenorrhea: Secondary | ICD-10-CM | POA: Diagnosis not present

## 2016-08-18 ENCOUNTER — Encounter (HOSPITAL_COMMUNITY): Payer: Self-pay | Admitting: *Deleted

## 2016-08-18 ENCOUNTER — Inpatient Hospital Stay (HOSPITAL_COMMUNITY)
Admission: AD | Admit: 2016-08-18 | Discharge: 2016-08-18 | Disposition: A | Discharge status: Home / Self Care | Payer: BLUE CROSS/BLUE SHIELD | Source: Ambulatory Visit | Attending: Obstetrics and Gynecology | Admitting: Obstetrics and Gynecology

## 2016-08-18 DIAGNOSIS — O209 Hemorrhage in early pregnancy, unspecified: Secondary | ICD-10-CM

## 2016-08-18 DIAGNOSIS — Z87891 Personal history of nicotine dependence: Secondary | ICD-10-CM | POA: Insufficient documentation

## 2016-08-18 DIAGNOSIS — O200 Threatened abortion: Secondary | ICD-10-CM | POA: Insufficient documentation

## 2016-08-18 DIAGNOSIS — N939 Abnormal uterine and vaginal bleeding, unspecified: Secondary | ICD-10-CM | POA: Diagnosis present

## 2016-08-18 DIAGNOSIS — Z3A01 Less than 8 weeks gestation of pregnancy: Secondary | ICD-10-CM | POA: Insufficient documentation

## 2016-08-18 LAB — URINALYSIS, ROUTINE W REFLEX MICROSCOPIC
Bilirubin Urine: NEGATIVE
Glucose, UA: NEGATIVE mg/dL
Ketones, ur: 15 mg/dL — AB
LEUKOCYTES UA: NEGATIVE
NITRITE: NEGATIVE
PROTEIN: 30 mg/dL — AB
Specific Gravity, Urine: 1.02 (ref 1.005–1.030)
pH: 7.5 (ref 5.0–8.0)

## 2016-08-18 LAB — URINALYSIS, MICROSCOPIC (REFLEX)

## 2016-08-18 LAB — POCT PREGNANCY, URINE: PREG TEST UR: POSITIVE — AB

## 2016-08-18 MED ORDER — HYDROCODONE-ACETAMINOPHEN 5-325 MG PO TABS: 1.0000 | ORAL_TABLET | Freq: Four times a day (QID) | ORAL | 0 refills | Status: DC | PRN

## 2016-08-18 NOTE — MAU Note (Signed)
Pt c/o vaginal spotting that started Saturday, has progressively gotten heavier and darker. Started passing some small clots yesterday. Has changed pad 3 times. Started having lower abdominal cramping today-took 2 Tylenol at 1630-1700. Did not help. Rates 8/10 at its worst. Had 3 u/s in doctor office-sac has been empty every time. Unsure of LMP.

## 2016-08-18 NOTE — MAU Note (Signed)
I believe I am having a miscarriage. Dr Velvet BatheLedger told me that may happen. Started spotting last Saturday. Spotting has become from frequent over the wk and today having cramping and more bleeding with small clots

## 2016-08-18 NOTE — MAU Provider Note (Signed)
History     CSN: [id number]  Arrival date and time: 08/18/16 2136   First Provider Initiated Contact with Patient 08/18/16 2210      Chief Complaint  Patient presents with  . Vaginal Bleeding   HPI Ms. Stephanie Watts is a 40 y.o. G2P0010 at 1965w5d who presents to MAU today with complaint of vaginal bleeding. The patient states that she should be 13 weeks by LMP, but recent US in the office measured her around 7 weeks. She has had spotting since Saturday, but bleeding became heavier with few small clots since yesterday. She has used 3 pads today. She states intermittent lower abdominal cramping. At worst rated pain at 8/10, but very mild pain now. She states her last US was this past Wednesday and showed only GS without YS or FP. She states that she has another US scheduled for Tuesday to see if YS develops, but that there was high likelihood of blighted ovum per Dr. Elon SpannerLeger. She denies recent intercourse, N/V or fever.     OB History    Gravida Para Term Preterm AB Living   2 0 0 0 1 0   SAB TAB Ectopic Multiple Live Births   0 1 0 0 0      Past Medical History:  Diagnosis Date  . Allergic rhinitis   . Hx of herpes genitalis   . Migraine headache   . Occipital headache     Past Surgical History:  Procedure Laterality Date  . FACIAL RECONSTRUCTION SURGERY    . NO PAST SURGERIES      Family History  Problem Relation Age of Onset  . Hypertension Mother   . High Cholesterol Father   . Hypertension Father   . Healthy Sister   . Healthy Brother   . Congestive Heart Failure Paternal Grandfather     Social History  Substance Use Topics  . Smoking status: Former Smoker    Types: Cigarettes  . Smokeless tobacco: Never Used     Comment: every 6 mths or so  . Alcohol use Not on file    Allergies: No Known Allergies  No prescriptions prior to admission.    Review of Systems  Constitutional: Negative for fever and malaise/fatigue.  Gastrointestinal: Positive for  abdominal pain. Negative for constipation, diarrhea, nausea and vomiting.  Genitourinary: Negative for dysuria, frequency and urgency.       + vaginal bleeding   Physical Exam   Blood pressure 123/75, pulse 91, temperature 98.7 F (37.1 C), resp. rate 17, height 5\' 3"  (1.6 m), weight 163 lb 12.8 oz (74.3 kg), last menstrual period 05/08/2016, SpO2 100 %.  Physical Exam  Nursing note and vitals reviewed. Constitutional: She is oriented to person, place, and time. She appears well-developed and well-nourished. No distress.  HENT:  Head: Normocephalic and atraumatic.  Cardiovascular: Normal rate.   Respiratory: Effort normal.  GI: Soft. She exhibits no distension and no mass. There is tenderness (very mild lower abdominal tenderness to palpation bilaterally). There is no rebound and no guarding.  Genitourinary: Uterus is not enlarged and not tender. Cervix exhibits no motion tenderness, no discharge and no friability. There is bleeding (small blood with one small clot) in the vagina. No vaginal discharge found.  Neurological: She is alert and oriented to person, place, and time.  Skin: Skin is warm and dry. No erythema.  Psychiatric: She has a normal mood and affect.    Results for orders placed or performed during the hospital encounter  of 08/18/16 (from the past 24 hour(s))  Urinalysis, Routine w reflex microscopic     Status: Abnormal   Collection Time: 08/18/16  9:55 PM  Result Value Ref Range   Color, Urine RED (A) YELLOW   APPearance CLOUDY (A) CLEAR   Specific Gravity, Urine 1.020 1.005 - 1.030   pH 7.5 5.0 - 8.0   Glucose, UA NEGATIVE NEGATIVE mg/dL   Hgb urine dipstick LARGE (A) NEGATIVE   Bilirubin Urine NEGATIVE NEGATIVE   Ketones, ur 15 (A) NEGATIVE mg/dL   Protein, ur 30 (A) NEGATIVE mg/dL   Nitrite NEGATIVE NEGATIVE   Leukocytes, UA NEGATIVE NEGATIVE  Urinalysis, Microscopic (reflex)     Status: Abnormal   Collection Time: 08/18/16  9:55 PM  Result Value Ref Range    RBC / HPF TOO NUMEROUS TO COUNT 0 - 5 RBC/hpf   WBC, UA 0-5 0 - 5 WBC/hpf   Bacteria, UA RARE (A) NONE SEEN   Squamous Epithelial / LPF 0-5 (A) NONE SEEN  Pregnancy, urine POC     Status: Abnormal   Collection Time: 08/18/16 10:11 PM  Result Value Ref Range   Preg Test, Ur POSITIVE (A) NEGATIVE  ABO/Rh     Status: None (Preliminary result)   Collection Time: 08/18/16 10:58 PM  Result Value Ref Range   ABO/RH(D) A POS     MAU Course  Procedures None  MDM +UPT UA today  Discussed patient with Dr. Renaldo FiddlerAdkins. Given recent US results in the office, will need to confirm blood type today. Otherwise, patient can be discharged with Rx for Vicodin and bleeding precautions. Follow-up in the office as scheduled on Tuesday.   Assessment and Plan  A: Vaginal bleeding in pregnancy, first trimester Threatened miscarriage  P: Discharge home Rx for Vicodin given to patient Bleeding precautions discussed Patient advised to follow-up with [facility] as scheduled on Tuesday or call sooner if symptoms change or worsen this weekend Patient may return to MAU as needed or if her condition were to change or worsen  Marny LowensteinJulie N Wenzel, PA-C  08/19/2016, 12:29 AM

## 2016-08-18 NOTE — Discharge Instructions (Signed)

## 2016-08-19 LAB — ABO/RH: ABO/RH(D): A POS

## 2016-08-23 DIAGNOSIS — O021 Missed abortion: Secondary | ICD-10-CM | POA: Diagnosis not present

## 2016-08-30 DIAGNOSIS — O039 Complete or unspecified spontaneous abortion without complication: Secondary | ICD-10-CM | POA: Diagnosis not present

## 2016-11-02 DIAGNOSIS — S8011XA Contusion of right lower leg, initial encounter: Secondary | ICD-10-CM | POA: Diagnosis not present

## 2016-11-02 DIAGNOSIS — M79604 Pain in right leg: Secondary | ICD-10-CM | POA: Diagnosis not present

## 2017-01-31 DIAGNOSIS — N912 Amenorrhea, unspecified: Secondary | ICD-10-CM | POA: Diagnosis not present

## 2017-02-02 DIAGNOSIS — N912 Amenorrhea, unspecified: Secondary | ICD-10-CM | POA: Diagnosis not present

## 2017-02-05 DIAGNOSIS — N912 Amenorrhea, unspecified: Secondary | ICD-10-CM | POA: Diagnosis not present

## 2017-02-07 DIAGNOSIS — N912 Amenorrhea, unspecified: Secondary | ICD-10-CM | POA: Diagnosis not present

## 2017-02-12 DIAGNOSIS — N912 Amenorrhea, unspecified: Secondary | ICD-10-CM | POA: Diagnosis not present

## 2017-02-12 DIAGNOSIS — N911 Secondary amenorrhea: Secondary | ICD-10-CM | POA: Diagnosis not present

## 2017-02-16 DIAGNOSIS — N911 Secondary amenorrhea: Secondary | ICD-10-CM | POA: Diagnosis not present

## 2017-02-27 DIAGNOSIS — O021 Missed abortion: Secondary | ICD-10-CM | POA: Diagnosis not present

## 2017-02-27 DIAGNOSIS — N911 Secondary amenorrhea: Secondary | ICD-10-CM | POA: Diagnosis not present

## 2017-03-15 DIAGNOSIS — O039 Complete or unspecified spontaneous abortion without complication: Secondary | ICD-10-CM | POA: Diagnosis not present

## 2017-05-22 DIAGNOSIS — Z23 Encounter for immunization: Secondary | ICD-10-CM | POA: Diagnosis not present

## 2017-06-23 ENCOUNTER — Encounter (HOSPITAL_COMMUNITY): Payer: Self-pay

## 2017-08-08 ENCOUNTER — Other Ambulatory Visit: Payer: Self-pay | Admitting: Family Medicine

## 2017-08-08 ENCOUNTER — Other Ambulatory Visit (HOSPITAL_COMMUNITY)
Admission: RE | Admit: 2017-08-08 | Discharge: 2017-08-08 | Disposition: A | Discharge status: Home / Self Care | Payer: BLUE CROSS/BLUE SHIELD | Source: Ambulatory Visit | Attending: Family Medicine | Admitting: Family Medicine

## 2017-08-08 DIAGNOSIS — N898 Other specified noninflammatory disorders of vagina: Secondary | ICD-10-CM | POA: Diagnosis not present

## 2017-08-08 DIAGNOSIS — Z136 Encounter for screening for cardiovascular disorders: Secondary | ICD-10-CM | POA: Diagnosis not present

## 2017-08-08 DIAGNOSIS — Z01411 Encounter for gynecological examination (general) (routine) with abnormal findings: Secondary | ICD-10-CM | POA: Diagnosis not present

## 2017-08-08 DIAGNOSIS — Z23 Encounter for immunization: Secondary | ICD-10-CM | POA: Diagnosis not present

## 2017-08-08 DIAGNOSIS — Z0000 Encounter for general adult medical examination without abnormal findings: Secondary | ICD-10-CM | POA: Diagnosis not present

## 2017-08-08 DIAGNOSIS — R8290 Unspecified abnormal findings in urine: Secondary | ICD-10-CM | POA: Diagnosis not present

## 2017-08-09 DIAGNOSIS — N912 Amenorrhea, unspecified: Secondary | ICD-10-CM | POA: Diagnosis not present

## 2017-08-09 LAB — CYTOLOGY - PAP: DIAGNOSIS: NEGATIVE

## 2017-08-13 DIAGNOSIS — N912 Amenorrhea, unspecified: Secondary | ICD-10-CM | POA: Diagnosis not present

## 2017-08-24 DIAGNOSIS — N390 Urinary tract infection, site not specified: Secondary | ICD-10-CM | POA: Diagnosis not present

## 2017-08-24 DIAGNOSIS — N760 Acute vaginitis: Secondary | ICD-10-CM | POA: Diagnosis not present

## 2017-08-24 DIAGNOSIS — B373 Candidiasis of vulva and vagina: Secondary | ICD-10-CM | POA: Diagnosis not present

## 2017-08-29 DIAGNOSIS — N911 Secondary amenorrhea: Secondary | ICD-10-CM | POA: Diagnosis not present

## 2017-09-13 DIAGNOSIS — Z3685 Encounter for antenatal screening for Streptococcus B: Secondary | ICD-10-CM | POA: Diagnosis not present

## 2017-09-13 DIAGNOSIS — Z3401 Encounter for supervision of normal first pregnancy, first trimester: Secondary | ICD-10-CM | POA: Diagnosis not present

## 2017-09-13 LAB — OB RESULTS CONSOLE HEPATITIS B SURFACE ANTIGEN: Hepatitis B Surface Ag: NEGATIVE

## 2017-09-13 LAB — OB RESULTS CONSOLE RUBELLA ANTIBODY, IGM: RUBELLA: IMMUNE

## 2017-09-13 LAB — OB RESULTS CONSOLE ANTIBODY SCREEN: Antibody Screen: NEGATIVE

## 2017-09-13 LAB — OB RESULTS CONSOLE HIV ANTIBODY (ROUTINE TESTING): HIV: NONREACTIVE

## 2017-09-13 LAB — OB RESULTS CONSOLE GC/CHLAMYDIA
Chlamydia: NEGATIVE
GC PROBE AMP, GENITAL: NEGATIVE

## 2017-09-13 LAB — OB RESULTS CONSOLE RPR: RPR: NONREACTIVE

## 2017-09-13 LAB — OB RESULTS CONSOLE ABO/RH: RH Type: POSITIVE

## 2017-09-25 DIAGNOSIS — Z3480 Encounter for supervision of other normal pregnancy, unspecified trimester: Secondary | ICD-10-CM | POA: Diagnosis not present

## 2017-09-25 DIAGNOSIS — Z3401 Encounter for supervision of normal first pregnancy, first trimester: Secondary | ICD-10-CM | POA: Diagnosis not present

## 2017-09-25 DIAGNOSIS — Z113 Encounter for screening for infections with a predominantly sexual mode of transmission: Secondary | ICD-10-CM | POA: Diagnosis not present

## 2017-10-10 DIAGNOSIS — O285 Abnormal chromosomal and genetic finding on antenatal screening of mother: Secondary | ICD-10-CM | POA: Diagnosis not present

## 2017-10-10 DIAGNOSIS — Z3682 Encounter for antenatal screening for nuchal translucency: Secondary | ICD-10-CM | POA: Diagnosis not present

## 2017-10-10 DIAGNOSIS — Z3A12 12 weeks gestation of pregnancy: Secondary | ICD-10-CM | POA: Diagnosis not present

## 2017-10-10 DIAGNOSIS — O09511 Supervision of elderly primigravida, first trimester: Secondary | ICD-10-CM | POA: Diagnosis not present

## 2017-10-24 ENCOUNTER — Other Ambulatory Visit (HOSPITAL_COMMUNITY): Payer: Self-pay | Admitting: Obstetrics and Gynecology

## 2017-10-24 DIAGNOSIS — Z3A18 18 weeks gestation of pregnancy: Secondary | ICD-10-CM

## 2017-10-24 DIAGNOSIS — O09522 Supervision of elderly multigravida, second trimester: Secondary | ICD-10-CM

## 2017-10-24 DIAGNOSIS — Z3689 Encounter for other specified antenatal screening: Secondary | ICD-10-CM

## 2017-11-20 ENCOUNTER — Encounter (HOSPITAL_COMMUNITY): Payer: Self-pay | Admitting: *Deleted

## 2017-11-22 ENCOUNTER — Other Ambulatory Visit (HOSPITAL_COMMUNITY): Payer: Self-pay | Admitting: Obstetrics and Gynecology

## 2017-11-22 ENCOUNTER — Other Ambulatory Visit: Payer: Self-pay

## 2017-11-22 ENCOUNTER — Encounter (HOSPITAL_COMMUNITY): Payer: Self-pay

## 2017-11-22 ENCOUNTER — Ambulatory Visit (HOSPITAL_COMMUNITY)
Admission: RE | Admit: 2017-11-22 | Discharge: 2017-11-22 | Disposition: A | Discharge status: Home / Self Care | Payer: BLUE CROSS/BLUE SHIELD | Source: Ambulatory Visit | Attending: Obstetrics and Gynecology | Admitting: Obstetrics and Gynecology

## 2017-11-22 DIAGNOSIS — Z3A18 18 weeks gestation of pregnancy: Secondary | ICD-10-CM

## 2017-11-22 DIAGNOSIS — Z3689 Encounter for other specified antenatal screening: Secondary | ICD-10-CM

## 2017-11-22 DIAGNOSIS — O09522 Supervision of elderly multigravida, second trimester: Secondary | ICD-10-CM

## 2017-11-22 DIAGNOSIS — O285 Abnormal chromosomal and genetic finding on antenatal screening of mother: Secondary | ICD-10-CM

## 2017-11-22 DIAGNOSIS — Z315 Encounter for genetic counseling: Secondary | ICD-10-CM | POA: Insufficient documentation

## 2017-11-22 DIAGNOSIS — O351XX0 Maternal care for (suspected) chromosomal abnormality in fetus, not applicable or unspecified: Secondary | ICD-10-CM | POA: Diagnosis not present

## 2017-11-22 DIAGNOSIS — O289 Unspecified abnormal findings on antenatal screening of mother: Secondary | ICD-10-CM | POA: Diagnosis not present

## 2017-11-22 DIAGNOSIS — Z363 Encounter for antenatal screening for malformations: Secondary | ICD-10-CM

## 2017-11-22 DIAGNOSIS — O281 Abnormal biochemical finding on antenatal screening of mother: Secondary | ICD-10-CM

## 2017-11-22 DIAGNOSIS — O3510X0 Maternal care for (suspected) chromosomal abnormality in fetus, unspecified, not applicable or unspecified: Secondary | ICD-10-CM

## 2017-11-22 HISTORY — DX: Maternal care for (suspected) chromosomal abnormality in fetus, unspecified, not applicable or unspecified: O35.10X0

## 2017-11-22 HISTORY — DX: Maternal care for (suspected) chromosomal abnormality in fetus, not applicable or unspecified: O35.1XX0

## 2017-11-22 IMAGING — US US MFM OB DETAIL+14 WK
1 series · 14 of 28 positions shown · non-contrast
Comparison: none

[Series 1: us mfm ob detail+14 wk · 108 acquisitions, 14 frames shown]
[im 4/108]
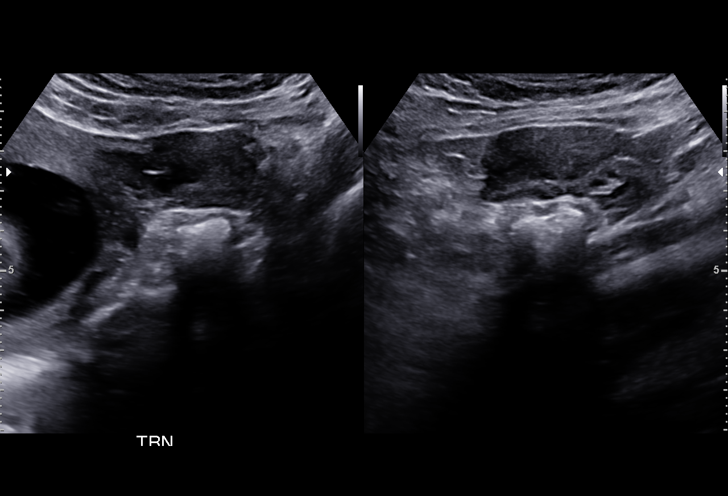
[im 12/108]
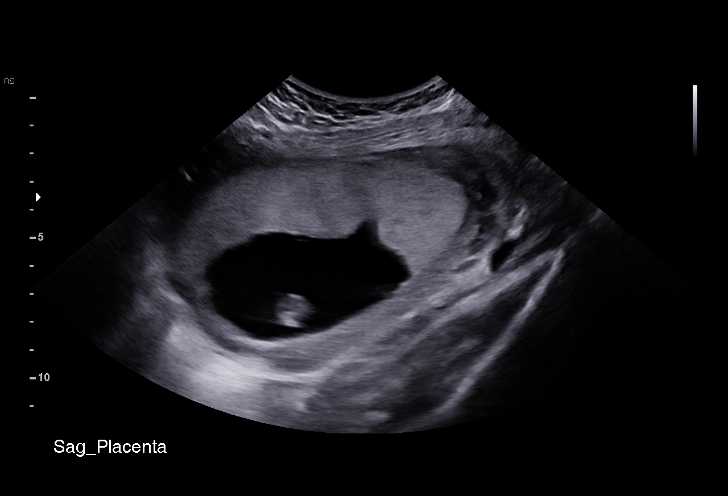
[im 20/108]
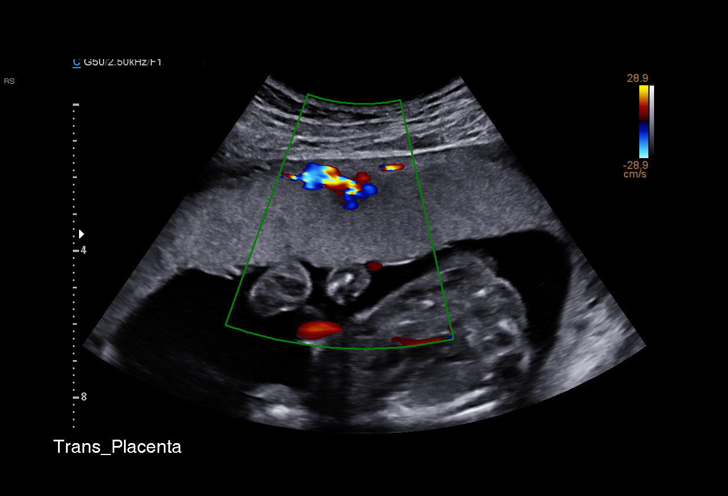
[im 28/108]
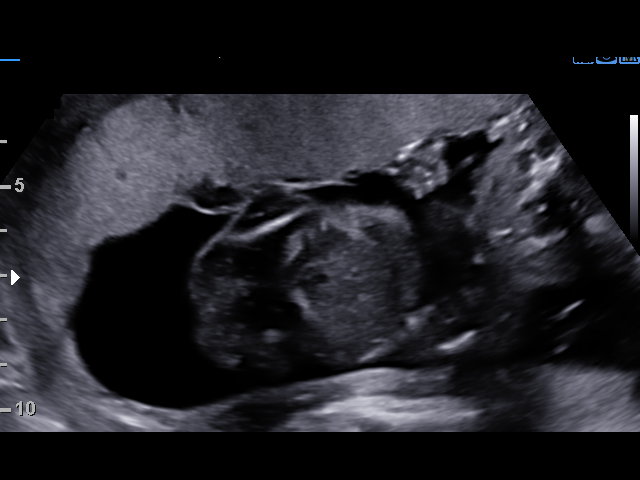
[im 36/108]
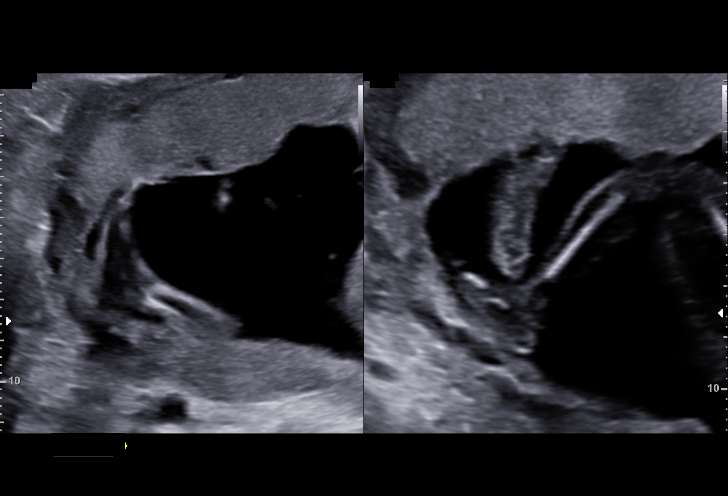
[im 44/108]
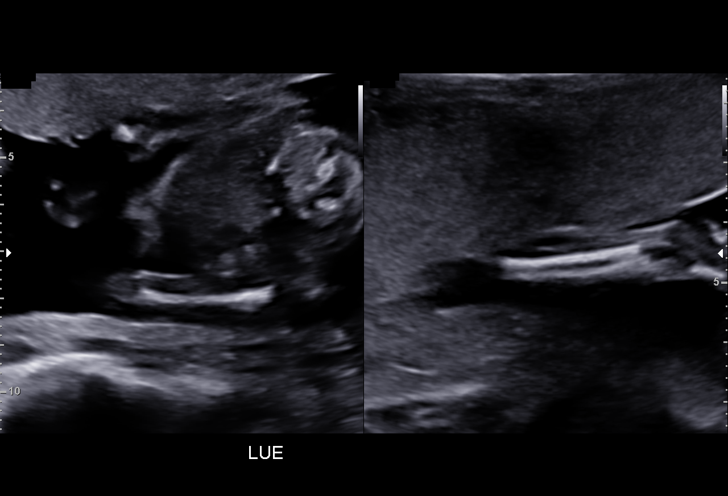
[im 52/108]
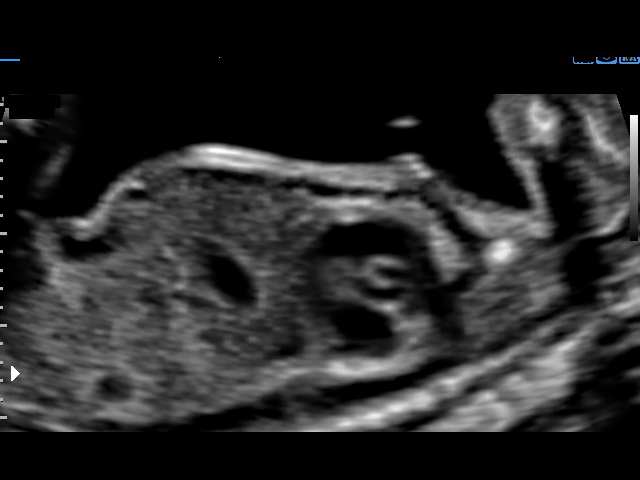
[im 60/108]
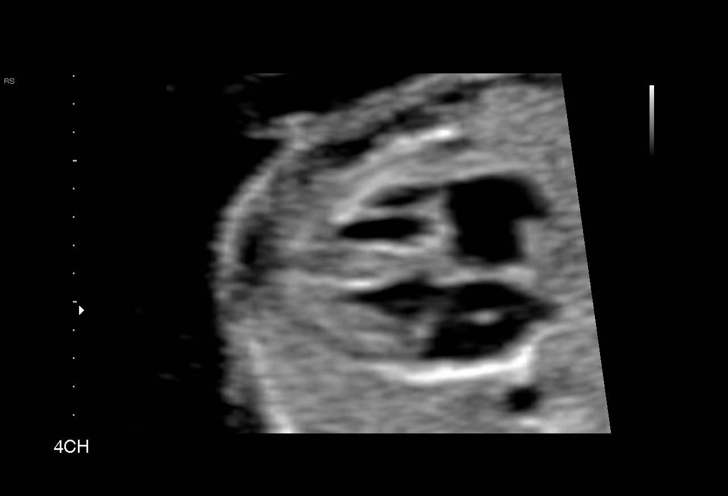
[im 68/108]
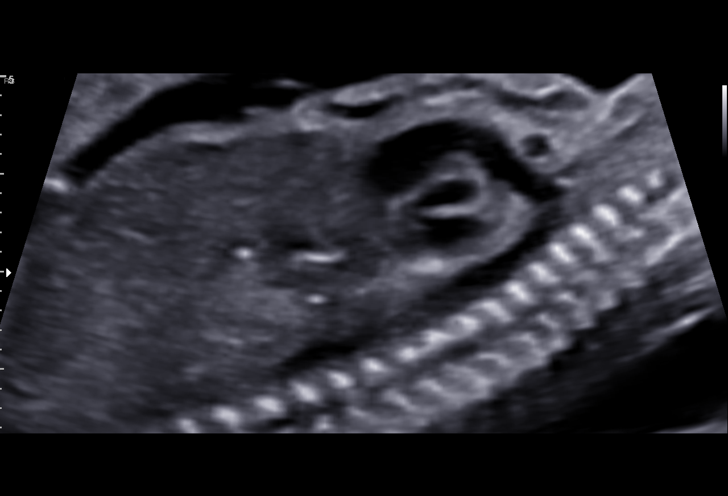
[im 76/108]
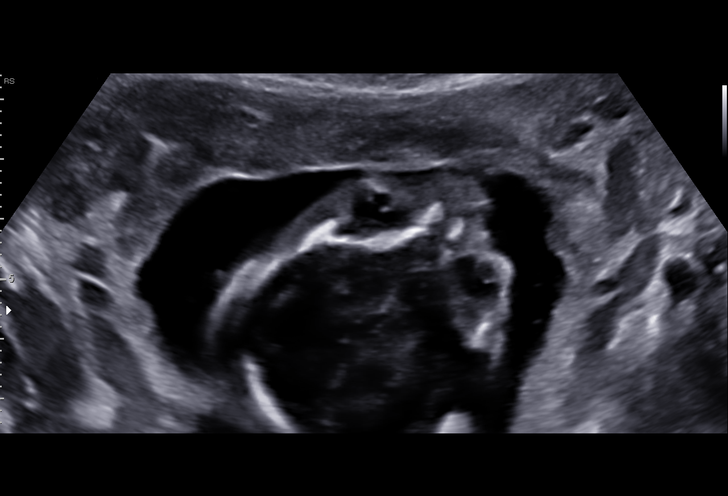
[im 84/108]
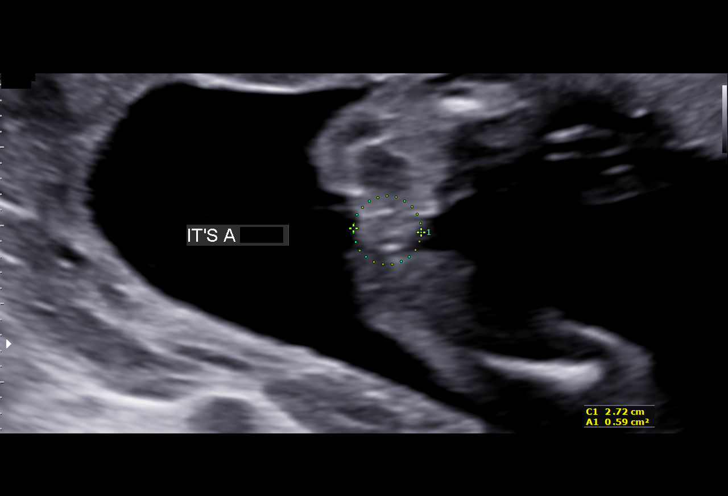
[im 92/108]
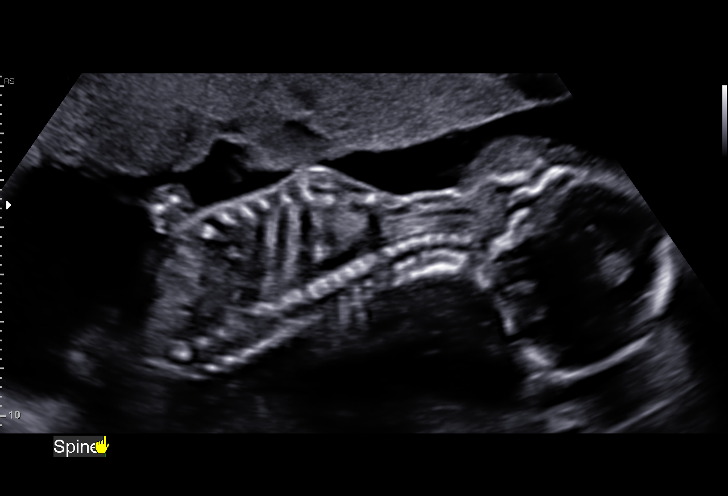
[im 100/108]
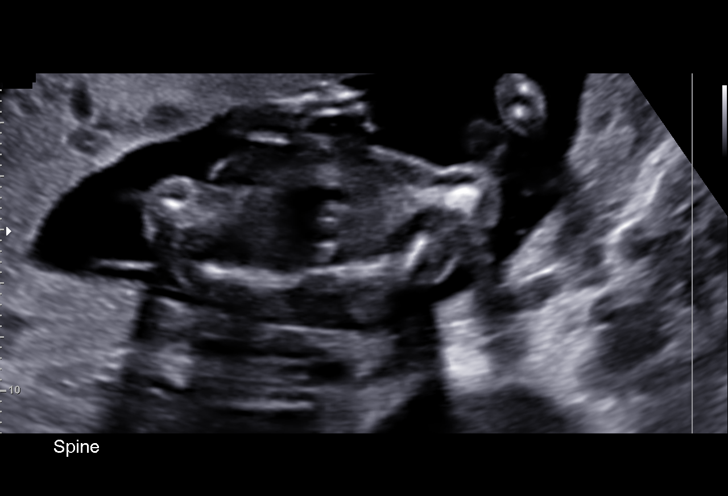
[im 108/108]
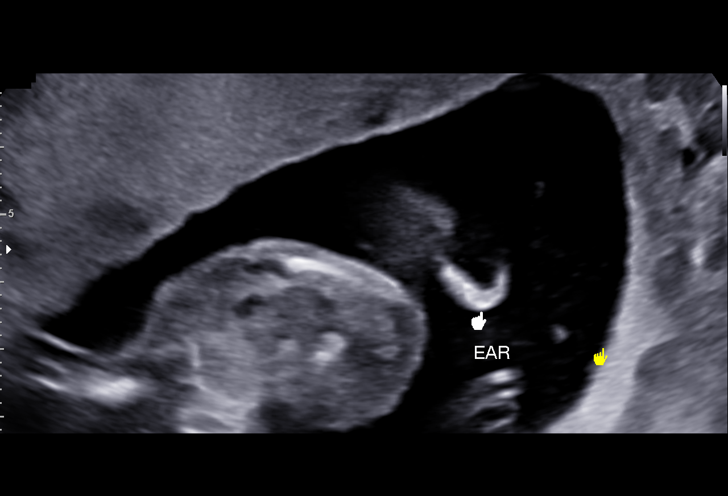

[14 of 28 positions shown; findings below may reference images not displayed]

1  ESPERANSA              [PHONE_NUMBER]      [PHONE_NUMBER]     [PHONE_NUMBER]
Indications

18 weeks gestation of pregnancy
Encounter for antenatal screening for          [LN]
malformations
Advanced maternal age multigravida 35+,        [LN]
second trimester
Abnormal fetal ultrasound ( NT 3.6mm)          [LN]
Abnormal chromosomal and genetic finding       [LN]
on antenatal screening of mother (NIPS)
OB History

Gravidity:    4          SAB:   2
TOP:          1
Fetal Evaluation

Num Of Fetuses:     1
Fetal Heart         153
Rate(bpm):
Cardiac Activity:   Observed
Presentation:       Cephalic
Placenta:           Anterior, above cervical os
P. Cord Insertion:  Visualized, central

Amniotic Fluid
AFI FV:      Subjectively within normal limits

Largest Pocket(cm)
4.36
Biometry
BPD:      43.8  mm     G. Age:  19w 2d         73  %    CI:        72.06   %    70 - 86
FL/HC:      16.0   %    16.1 -
HC:      164.2  mm     G. Age:  19w 1d         64  %    HC/AC:      1.15        1.09 -
AC:      142.4  mm     G. Age:  19w 4d         75  %    FL/BPD:     60.0   %
FL:       26.3  mm     G. Age:  18w 0d         20  %    FL/AC:      18.5   %    20 - 24
HUM:        25  mm     G. Age:  17w 6d         26  %
CER:      19.6  mm     G. Age:  18w 6d         53  %
NFT:       5.4  mm
CM:        4.8  mm

Est. FW:     263  gm      0 lb 9 oz     49  %
Gestational Age

LMP:           18w 5d        Date:  [DATE]                 EDD:   [DATE]
U/S Today:     19w 0d                                        EDD:   [DATE]
Best:          18w 5d     Det. By:  LMP  ([DATE])          EDD:   [DATE]
Anatomy

Cranium:               Appears normal         Aortic Arch:            Appears normal
Cavum:                 Appears normal         Ductal Arch:            Appears normal
Ventricles:            Not well visualized    Diaphragm:              Appears normal
Choroid Plexus:        Appears normal         Stomach:                Appears normal, left
sided
Cerebellum:            Appears normal         Abdomen:                Appears normal
Posterior Fossa:       Appears normal         Abdominal Wall:         Appears nml (cord
insert, abd wall)
Nuchal Fold:           Appears normal         Cord Vessels:           Appears normal (3
vessel cord)
Face:                  Orbits nl; profile not Kidneys:                Appear normal
well visualized
Lips:                  Not well visualized    Bladder:                Appears normal
Thoracic:              Appears normal         Spine:                  Not well visualized
Heart:                 Appears normal         Upper Extremities:      Appears normal
(4CH, axis, and
situs)
RVOT:                  Appears normal         Lower Extremities:      Appears normal
LVOT:                  Appears normal

Other:  Fetus appears to be a female. Heels and 5th digit visualized.
Technically difficult due to fetal position.
Cervix Uterus Adnexa

Cervix
Length:           3.36  cm.
Normal appearance by transabdominal scan.

Uterus
No abnormality visualized.

Left Ovary
Within normal limits.

Right Ovary
Not visualized.

Adnexa:       No abnormality visualized. No adnexal mass
visualized.
Comments

The patient also had genetics counseling today.  For full
details of that visit please see accompanying documentation.
The patient decided to forgo invasive testing.
Impression

Single living intrauterine pregnancy at 18 weeks 5 days.
Appropriate fetal growth (49%).
Normal amniotic fluid volume.
The fetal anatomic survey is not complete.
Hypoplasitic nasal bone.
No gross fetal anomalies identified.
Recommendations

Recommend follow-up ultrasound examination in 4 weeks to
reassess fetal growth and anatomy.

## 2017-11-22 NOTE — Progress Notes (Signed)
Genetic Counseling  High-Risk Gestation Note  Appointment Date:  11/22/2017 Referred By: Ranae Pila, * Date of Birth:  Feb 02, 1976 Partner:  Lianne Cure   Pregnancy History: [id number] Estimated Date of Delivery: 04/20/18 Estimated Gestational Age: [redacted]w[redacted]d Attending: Rema Fendt, MD   Mrs. [name] and her husband, Mr. Caeley Dohrmann, were seen for genetic counseling because of high risk Trisomy 73 (Down syndrome) on noninvasive prenatal screen (NIPS)/prenatal cell free DNA testing through her OB provider. Patient is 42 y.o..    In summary:  Reviewed NIPS (Panorama) result  High risk T21  PPV approximately 95% given patient's age  Discussed AMA and associated risk for fetal aneuploidy  Detailed ultrasound performed today; see separate report  Absent nasal bone visualized today- reviewed association as soft marker of fetal aneuploidy, specifically Down syndrome  Discussed Down syndrome and associated features  Discussed options for additional screening  Ongoing Ultrasound  Fetal Echocardiogram  Discussed diagnostic testing options  Amniocentesis- declined  Postnatal chromosome analysis- patient would like to pursue this testing option  Reviewed family history concerns  They were counseled regarding maternal age and the association with risk for chromosome conditions due to nondisjunction with aging of the ova.   We reviewed chromosomes, nondisjunction, and the associated risk for fetal aneuploidy related to a maternal age of 42 y.o. at [redacted]w[redacted]d gestation.  They were counseled that the risk for aneuploidy decreases as gestational age increases, accounting for those pregnancies which spontaneously abort.    Mrs. Buonocore was offered NIPS (Panorama) through her OB provider because of maternal age. Mrs. Secrest was also reported to have an increased nuchal translucency (3.6 mm) on first trimester exam through her OB provider.  The results of NIPS (Panorama) were  consistent with high risk for fetal Trisomy 21 (Down syndrome), reported as a 9 out of 10 risk. We discussed that this testing identifies >99% of pregnancies with Down syndrome with a false positive rate of  ~0.1%. We reviewed that this testing is highly sensitive and specific, but is not considered diagnostic. We reviewed that the positive predictive value (PPV) is the percentage of those patients who have a positive NIPS result who actually have fetal Down syndrome. Specific to this laboratory and the patient's age, the PPV is ~95%. We spend significant time reviewing this technology and the accuracy of NIPS and other screening versus diagnostic tests.  We reviewed that the cell free DNA test can not distinguish between aneuploidy confined to the placenta, trisomy 21, translocation 21, or mosaic trisomy 21. We discussed the availability of amniocentesis or peripheral blood chromosome analysis for confirmation of the diagnosis. Risks, benefits, and limitations of amniocentesis were reviewed including the associated 1 in 300-500 risk for complications. This couple declined the option of amniocentesis. They wish to pursue postnatal confirmatory chromosome testing.  Detailed ultrasound was performed today. Absent/hypoplastic fetal nasal bone was visualized. Remaining visualized fetal anatomy was within normal limits. Complete ultrasound report under separate cover.  An absent or hypoplastic (undeveloped, or slightly smaller than expected) nasal bone is commonly a normal variation with no associated problems.  This may be a family trait and can be more common in some ethnic groups, such as the African American population.  However, an absent or hypoplastic nasal bone has been shown to increase the chance for Down syndrome in a pregnancy. We discussed that the ultrasound finding of a soft marker for fetal aneuploidy suggest a higher positive predictive value of the Panorama result than by the patient's  age alone.    Considering the very high suspicion of Down syndrome, they were counseled in detail regarding this diagnosis. We discussed that there are different types of Down syndrome, and each type is determined by the arrangement of the #21 chromosomes. Approximately 95% of cases of Down syndrome are trisomy 21 and 2-4% are due to a translocation involving chromosome 21. We reviewed chromosomes, nondisjunction, and that chromosome division errors happen by chance and are not usually inherited. They understand that confirmatory testing will provide an actual karyotype and will help to determine accurate risks of recurrence for a future pregnancy.   This couple was counseled that Down syndrome occurs once per every 800 births and is associated with specific features. However, there is a high degree of variability seen among children who have this condition, meaning that every child with Down syndrome will not be affected in exactly the same way, and some children will have more or less features than others. They were counseled that half of individuals with Down syndrome have a cardiac anomaly and ~10-15% have an intestinal difference. Other anomalies of various organ systems have also been described in association with this diagnosis. Although the major anatomy appears structurally normal by ultrasound, a fetal echocardiogram is recommended for a detailed evaluation of the fetal heart. They understand that screening tests cannot rule out all birth defects or genetic syndromes  We discussed that in general most individuals with Down syndrome have mild to moderate intellectual disability and likely will require extra assistance with school work. Approximately 70-80% of children with Down syndrome have hypotonia which may lead to delays in sitting, walking, and talking. Hypotonia does tend to improve with age and early intervention services such as physical, occupational, and speech therapies can help with achievement of  developmental milestones. We discussed that these therapies are typically provided to children (with qualifying diagnoses) from birth until age 513. We also discussed that Down syndrome is associated with characteristic facial features. Because of these facial features, many children with Down syndrome look similar to each other, but they were reminded that each child with Down syndrome is unique and will have many more features in common with his or her own family members.   We also reviewed that the AAP have established health supervision guidelines for individuals with Down syndrome. These guidelines provide medical management recommendations for various stages of life and can be a resource for families who have a child with Down syndrome as well as for health professionals. We also discussed that providing children with Down syndrome with a stimulating physical and social environment, as well as ensuring that the child receives adequate medical care and appropriate therapies, will help these children to reach their full potential. With the advances in medical technology, early intervention, and supportive therapies, many individuals with Down syndrome are able to live with an increasing degree of independence. Today, many adults with Down syndrome care for themselves, have jobs, and often times live in group homes or apartments where assistance is available if needed.   We discussed local and national support organizations. Mrs. Melvyn NethLewis has connected with the [facility] [place].  Additionally, we discussed that postnatal health management can be coordinated by a medical geneticist as well as with a multidisciplinary team of physicians (Down syndrome clinic). This couple was encouraged to contact us if they have any additional questions/concerns or if they need additional supportive or informational recommendations.   Mrs. Melvyn NethLewis was provided with written information regarding  cystic  fibrosis (CF), spinal muscular atrophy (SMA) and hemoglobinopathies including the carrier frequency, availability of carrier screening and prenatal diagnosis if indicated.  In addition, we discussed that CF and hemoglobinopathies are routinely screened for as part of the [place] newborn screening panel.  She previously had expanded carrier screening ([employer] 27 panel) through a previous provider, which was within normal limits for the conditions screened, including CF, SMA, and hemoglobinopathies.   Both family histories were reviewed and found to be contributory for Mr. Deery' brother with spina bifida. He described his brother to have a small opening which likely required surgery after delivery, and his brother has additional back surgeries in his 30s. He described his brother to not have any difficulty with ambulation and was not described to have major features of spina bifida. His brother's children and grandchild are reportedly healthy, and no additional relatives were reported with open neural tube defects. We discussed that spina bifida is a common birth differences and affects approximately 1 in 1000 live births.  They were counseled that NTDs occur as an isolated finding, in the majority of cases. Isolated NTDs are usually inherited in a multifactorial manner in which there is no prior family history.  Multifactorial conditions have both environmental and genetic factors that contribute to their development.  Both the genetic and environmental factors that contribute to the development of spina bifida are largely unknown; however, some medications and health conditions, such as uncontrolled diabetes and obesity, may increase the chance of spina bifida.  We also discussed that NTDs may occur as a feature of an underlying genetic syndrome or condition.  If the relative had a nonsyndromic, multifactorial NTD, the risk of recurrence is estimated to be 1-2% for a second degree relative.  If however, the  relative had an underlying genetic syndrome, the risk of recurrence depends upon the inheritance of the condition. We reviewed the benefits and limitations of screening for open neural tube defects in pregnancy.   Mrs. Perret also reported a female paternal second cousin with Down syndrome (unspecified type). We discussed that 95% of cases of Down syndrome are not inherited and are the result of non-disjunction.  Three to 4% of cases of Down syndrome are the result of a translocation involving chromosome #21.  We discussed the option of chromosome analysis to determine if an individual is a carrier of a balanced translocation involving chromosome #21.  If an individual carries a balanced translocation involving chromosome #21, then the chance to have a baby with Down syndrome would be greater than the maternal age-related risk.  The reported family history is most suggestive of sporadically occurring Down syndrome, but additional information is needed regarding this relative's karyotype to best assess recurrence risk for relatives. Further genetic counseling is warranted if more information is obtained.  Mrs. Tanney denied exposure to environmental toxins or chemical agents. She denied the use of alcohol, tobacco or street drugs. She denied significant viral illnesses during the course of her pregnancy.  I counseled this couple regarding the above risks and available options.  The approximate face-to-face time with the genetic counselor was 45 minutes.  Quinn Plowman, MS,  Certified Genetic Counselor 11/22/2017

## 2017-11-23 ENCOUNTER — Other Ambulatory Visit (HOSPITAL_COMMUNITY): Payer: Self-pay | Admitting: *Deleted

## 2017-11-23 DIAGNOSIS — O09529 Supervision of elderly multigravida, unspecified trimester: Secondary | ICD-10-CM

## 2017-12-06 ENCOUNTER — Other Ambulatory Visit: Payer: Self-pay

## 2017-12-06 ENCOUNTER — Encounter (HOSPITAL_COMMUNITY): Payer: Self-pay

## 2017-12-07 ENCOUNTER — Other Ambulatory Visit (HOSPITAL_COMMUNITY): Payer: Self-pay

## 2017-12-21 ENCOUNTER — Encounter (HOSPITAL_COMMUNITY): Payer: Self-pay

## 2017-12-21 ENCOUNTER — Ambulatory Visit (HOSPITAL_COMMUNITY)
Admission: RE | Admit: 2017-12-21 | Discharge: 2017-12-21 | Disposition: A | Discharge status: Home / Self Care | Payer: BLUE CROSS/BLUE SHIELD | Source: Ambulatory Visit | Attending: Obstetrics and Gynecology | Admitting: Obstetrics and Gynecology

## 2017-12-21 ENCOUNTER — Other Ambulatory Visit (HOSPITAL_COMMUNITY): Payer: Self-pay | Admitting: *Deleted

## 2017-12-21 DIAGNOSIS — O289 Unspecified abnormal findings on antenatal screening of mother: Secondary | ICD-10-CM

## 2017-12-21 DIAGNOSIS — Z362 Encounter for other antenatal screening follow-up: Secondary | ICD-10-CM | POA: Insufficient documentation

## 2017-12-21 DIAGNOSIS — Z3A22 22 weeks gestation of pregnancy: Secondary | ICD-10-CM | POA: Diagnosis not present

## 2017-12-21 DIAGNOSIS — O351XX0 Maternal care for (suspected) chromosomal abnormality in fetus, not applicable or unspecified: Secondary | ICD-10-CM | POA: Diagnosis not present

## 2017-12-21 DIAGNOSIS — O285 Abnormal chromosomal and genetic finding on antenatal screening of mother: Secondary | ICD-10-CM | POA: Insufficient documentation

## 2017-12-21 DIAGNOSIS — O09522 Supervision of elderly multigravida, second trimester: Secondary | ICD-10-CM | POA: Diagnosis not present

## 2017-12-21 DIAGNOSIS — O09529 Supervision of elderly multigravida, unspecified trimester: Secondary | ICD-10-CM

## 2017-12-21 DIAGNOSIS — O288 Other abnormal findings on antenatal screening of mother: Secondary | ICD-10-CM | POA: Diagnosis not present

## 2017-12-21 DIAGNOSIS — Z3A23 23 weeks gestation of pregnancy: Secondary | ICD-10-CM | POA: Diagnosis not present

## 2017-12-21 IMAGING — US US MFM OB FOLLOW-UP
1 series · 13 of 28 positions shown · non-contrast
Comparison: none

[Series 1: us mfm ob follow-up · 13 of 58 slices shown]
[im 3/58]
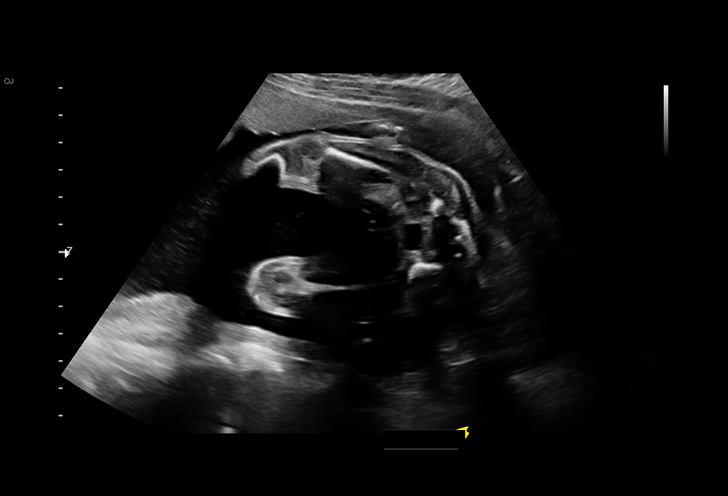
[im 7/58]
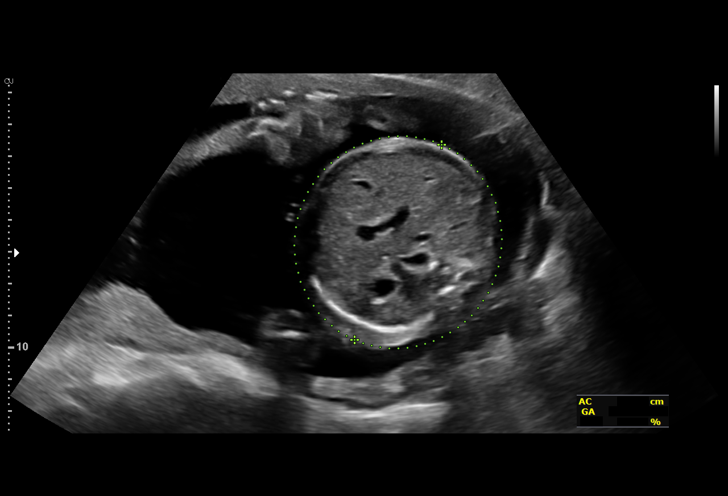
[im 11/58]
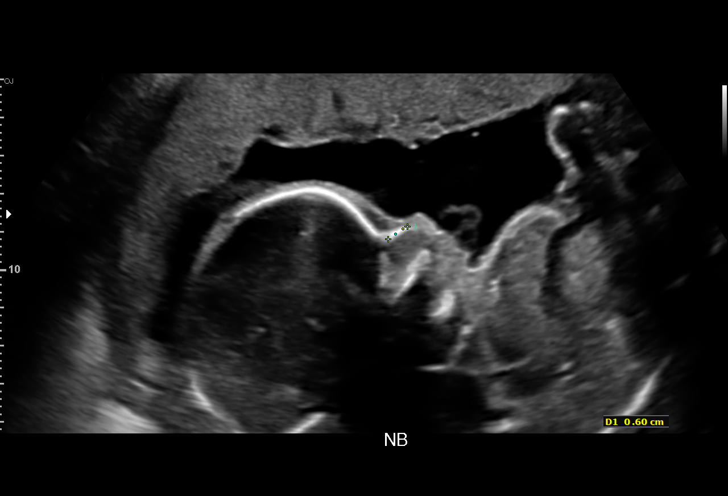
[im 15/58]
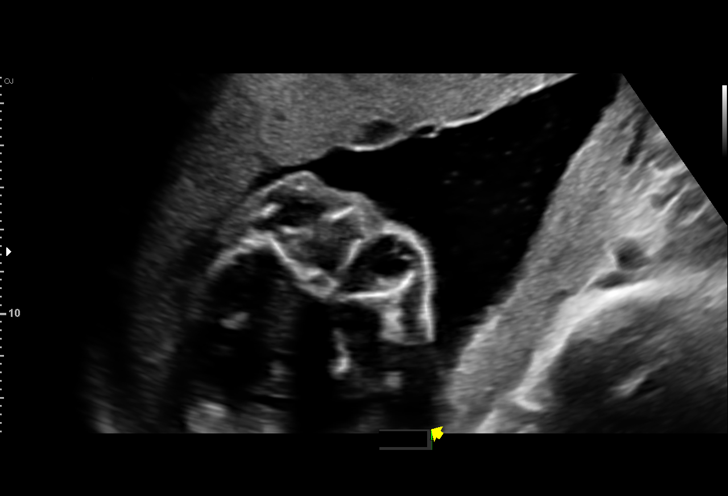
[im 20/58]
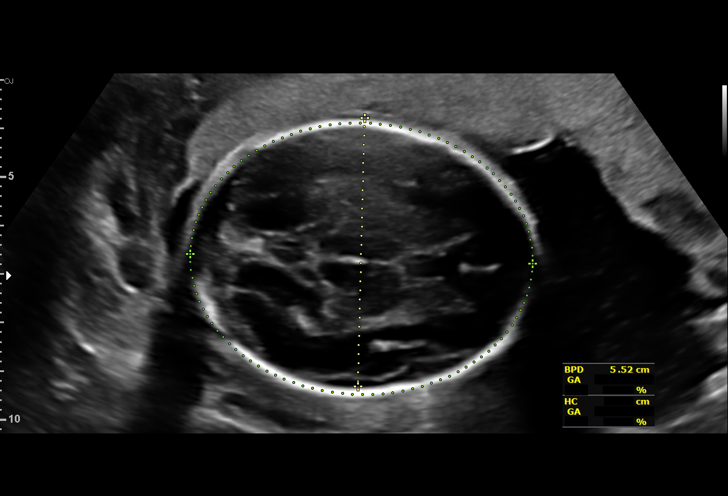
[im 24/58]
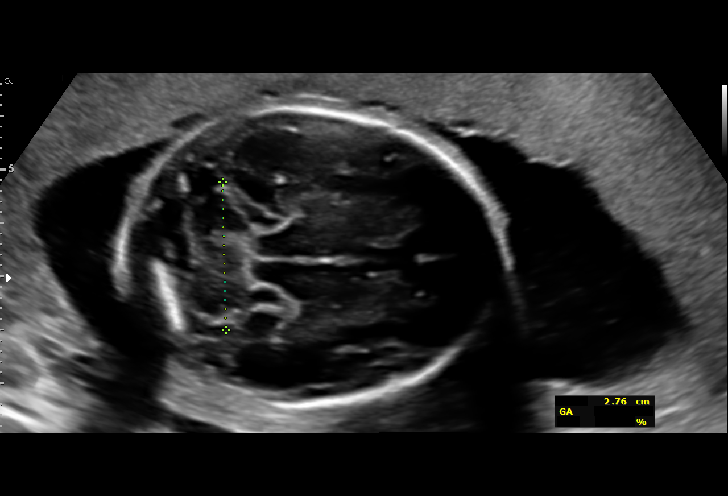
[im 30/58]
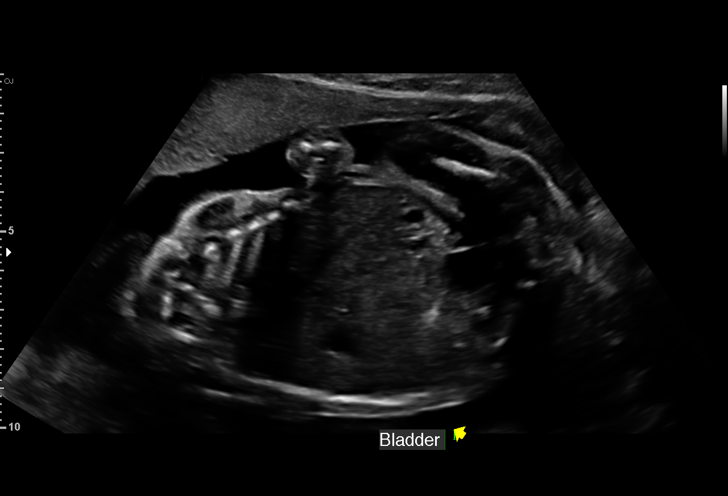
[im 34/58]
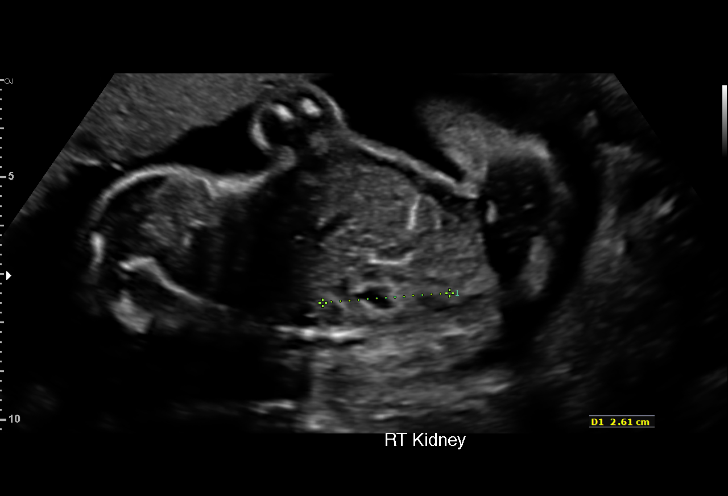
[im 39/58]
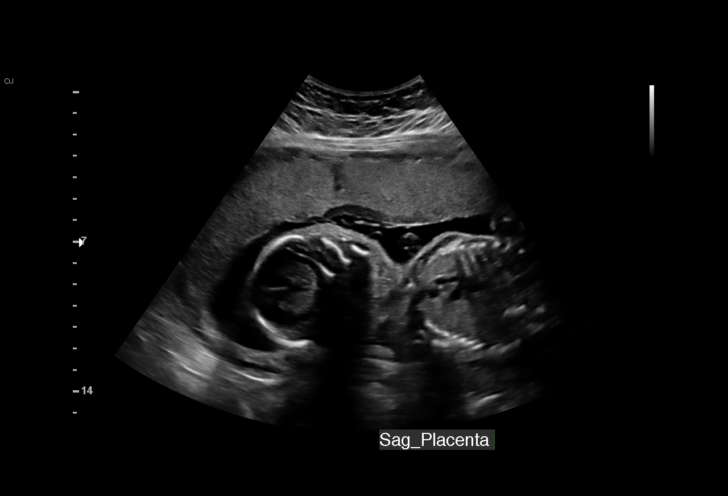
[im 43/58]
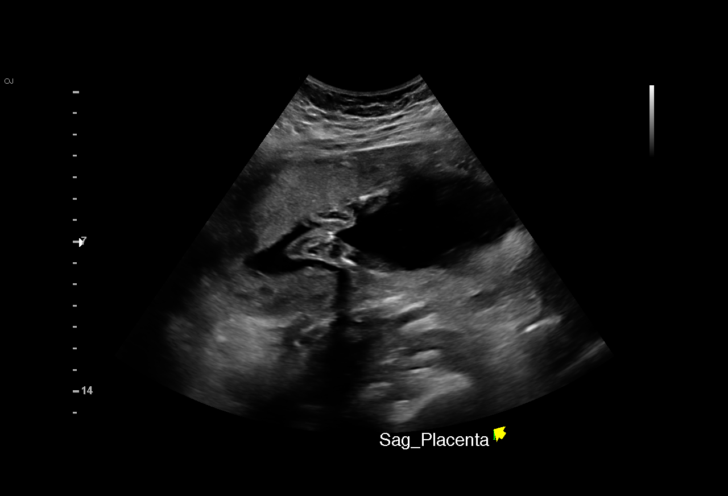
[im 47/58]
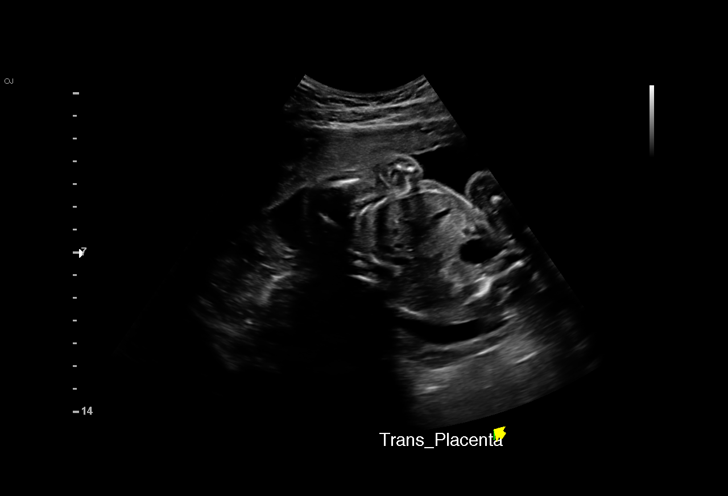
[im 51/58]
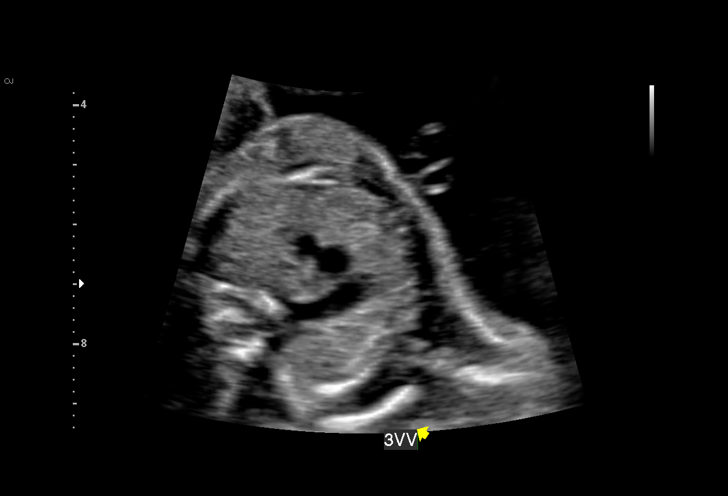
[im 55/58]
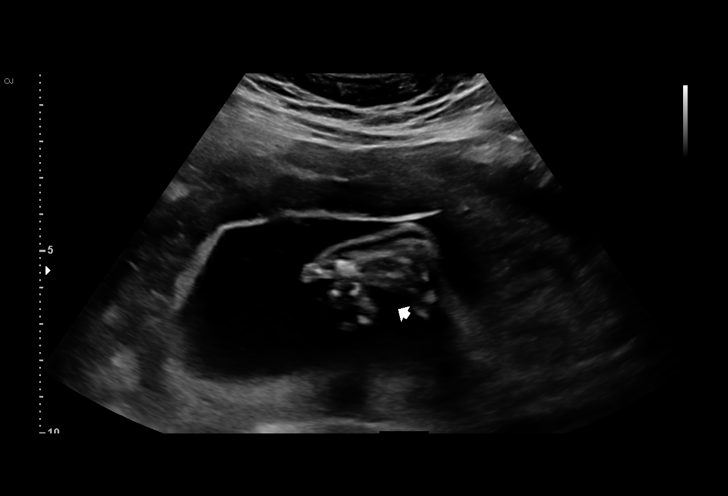

[13 of 28 positions shown; findings below may reference images not displayed]

1  FLOW           [PHONE_NUMBER]      [PHONE_NUMBER]     [PHONE_NUMBER]
Indications

22 weeks gestation of pregnancy
Advanced maternal age multigravida (41),       [R1]
second trimester-High risk NIPS
Abnormal fetal ultrasound ( NT 3.6mm)          [R1]
Abnormal chromosomal and genetic finding       [R1]
on antenatal screening of mother (T21 by
NIPS)
Encounter for other antenatal screening        [R1]
follow-up
OB History

Gravidity:    4          SAB:   2
TOP:          1
Fetal Evaluation

Num Of Fetuses:     1
Fetal Heart         140
Rate(bpm):
Cardiac Activity:   Observed
Presentation:       Breech
Placenta:           Anterior, above cervical os
P. Cord Insertion:  Visualized

Amniotic Fluid
AFI FV:      Subjectively within normal limits

Largest Pocket(cm)
4.89
Biometry

BPD:      55.7  mm     G. Age:  23w 0d         51  %    CI:        78.46   %    70 - 86
FL/HC:      18.8   %    19.2 -
HC:      198.9  mm     G. Age:  22w 1d         11  %    HC/AC:      0.97        1.05 -
AC:      205.5  mm     G. Age:  25w 1d         95  %    FL/BPD:     67.0   %    71 - 87
FL:       37.3  mm     G. Age:  21w 6d         13  %    FL/AC:      18.2   %    20 - 24
HUM:      35.2  mm     G. Age:  22w 1d         28  %
CER:      27.6  mm     G. Age:  24w 6d         91  %

Est. FW:     604  gm      1 lb 5 oz     63  %
Gestational Age

LMP:           22w 6d        Date:  [DATE]                 EDD:   [DATE]
U/S Today:     23w 0d                                        EDD:   [DATE]
Best:          22w 6d     Det. By:  LMP  ([DATE])          EDD:   [DATE]
Anatomy

Cranium:               Appears normal         Aortic Arch:            Previously seen
Cavum:                 Appears normal         Ductal Arch:            Previously seen
Ventricles:            Appears normal         Diaphragm:              Appears normal
Choroid Plexus:        Previously seen        Stomach:                Appears normal, left
sided
Cerebellum:            Appears normal         Abdomen:                Appears normal
Posterior Fossa:       Previously seen        Abdominal Wall:         Previously seen
Nuchal Fold:           Previously seen        Cord Vessels:           Appears normal (3
vessel cord)
Face:                  Appears normal         Kidneys:                Appear normal
(orbits and profile)
Lips:                  Appears normal         Bladder:                Appears normal
Thoracic:              Appears normal         Spine:                  Not well visualized
Heart:                 Abnormal, see          Upper Extremities:      Previously seen
comments
RVOT:                  Previously seen        Lower Extremities:      Previously seen
LVOT:                  Previously seen

Other:  Fetus appears to be a female. Heels and 5th digit prev. visualized.
Technically difficult due to fetal position.
Cervix Uterus Adnexa

Cervix
Length:           3.49  cm.
Normal appearance by transabdominal scan.

Uterus
No abnormality visualized.

Left Ovary
Size(cm)       2.5  x   1.9    x  1.8       Vol(ml):
Within normal limits.

Right Ovary
Size(cm)        3   x   2.2    x  1.9       Vol(ml):
Within normal limits.

Cul De Sac:   No free fluid seen.

Adnexa:       No abnormality visualized.
Impression

Singleton intrauterine pregnancy at 22+6 weeks with here for
repeat evaluation
Interval review of the anatomy shows no sonographic
markers for aneuploidy or structural anomalies
Views of the heart today are normal, but fetal echo today
shows "possible partial AV canal defect"
All relevant fetal anatomy has been visualized
Amniotic fluid volume is normal
Estimated fetal weight shows growth in the 63rd  percentile
Recommendations

The patient has F/U echo with pediatric cardiology in 6 weeks
Repeat scan here in 4 weeks

## 2017-12-21 NOTE — Addendum Note (Signed)
Encounter addended by: Emeline Darling on: 12/21/2017 12:32 PM  Actions taken: Imaging Exam ended

## 2018-01-08 NOTE — Progress Notes (Signed)
Cardiology Office Note:    Date:  01/10/2018   ID:  Stephanie Watts, DOB August 01, 1976, MRN [id number]  PCP:  Shaune Pollack, MD  Cardiologist:  Norman Herrlich, MD   Referring MD: Shaune Pollack, MD  ASSESSMENT:    1. Costochondral chest pain   2. Palpitations    PLAN:    In order of problems listed above:  1. Clinically she has chronic or recurrent costochondral chest pain.  For reassurance further evaluations of the cardiac echo performed.  I asked her to abstain from lifting boxes at work which may be provocative.  I do not think she requires any pharmacologic therapy and she is quite reassured. 2. Recently flared with over-the-counter decongestant she will abstain at this time after discussion she does not feel she requires a repeat ambulatory event monitor will contact me if the symptoms do not improve or worsen  Next appointment   Medication Adjustments/Labs and Tests Ordered: Current medicines are reviewed at length with the patient today.  Concerns regarding medicines are outlined above.  Orders Placed This Encounter  Procedures  . EKG 12-Lead  . ECHOCARDIOGRAM COMPLETE   No orders of the defined types were placed in this encounter.    Chief Complaint  Patient presents with  . New Patient (Initial Visit)  1 month  History of Present Illness:    Stephanie Watts is a 43 y.o.pregnant  female [facility] 04/02/18 who is being seen today for the evaluation of palpitation at the request of Shaune Pollack, MD.  She was seen by Dr Herbie Baltimore in October 2017 for chest discomfort and palpitation. An event monitor and treadmill EKG stress test were both normal.  Recently she is taken over-the-counter decongestant and she has noticed no more palpitation as well as brief momentary localized sharp parasternal chest pain.  He has had no shortness of breath syncope or sustained arrhythmia.  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 [redacted] weeks gestation of pregnancy   . Allergic rhinitis   . Chest pain with low risk for cardiac etiology 05/06/2016  . Hx of herpes genitalis   . Migraine headache   . Occipital headache   . Palpitations 05/06/2016  . Suspected chromosome anomaly of fetus affecting management of mother in singleton pregnancy, antepartum 11/22/2017   T21 risk on Panorama, patient would like postnatal chromosome analysis for baby    Past Surgical History:  Procedure Laterality Date  . BUNIONECTOMY    . FACIAL RECONSTRUCTION SURGERY      Current Medications: Current Meds  Medication Sig  . acetaminophen (TYLENOL) 325 MG tablet Take 650 mg by mouth every 6 (six) hours as needed.  . Prenatal Multivit-Min-Fe-FA (PRENATAL VITAMINS PO) Take 1 tablet by mouth daily.  . valACYclovir (VALTREX) 500 MG tablet Take 500 mg by mouth as needed.     Allergies:   Patient has no known allergies.   Social History   Socioeconomic History  . Marital status: Married    Spouse name: Not on file  . Number of children: Not on file  . Years of education: Not on file  . Highest education level: Not on file  Occupational History  . Not on file  Social Needs  . Financial resource strain: Not on file  . Food insecurity:    Worry: Not on file    Inability: Not on file  . Transportation needs:    Medical: Not  on file    Non-medical: Not on file  Tobacco Use  . Smoking status: Former Smoker    Types: Cigarettes  . Smokeless tobacco: Never Used  . Tobacco comment: every 6 mths or so  Substance and Sexual Activity  . Alcohol use: Not Currently    Alcohol/week: 0.6 - 1.2 oz    Types: 1 - 2 Glasses of wine per week  . Drug use: No  . Sexual activity: Yes  Lifestyle  . Physical activity:    Days per week: Not on file    Minutes per session: Not on file  . Stress: Not on file  Relationships  . Social connections:    Talks on phone: Not on file    Gets together: Not on file     Attends religious service: Not on file    Active member of club or organization: Not on file    Attends meetings of clubs or organizations: Not on file    Relationship status: Not on file  Other Topics Concern  . Not on file  Social History Narrative  . Not on file     Family History: The patient's family history includes Congestive Heart Failure in her paternal grandfather; Healthy in her brother and sister; High Cholesterol in her father; Hypertension in her father and mother.  ROS:   Review of Systems  Constitution: Negative.  HENT: Negative.   Eyes: Negative.   Cardiovascular: Positive for chest pain and palpitations.  Respiratory: Negative.   Endocrine: Negative.   Hematologic/Lymphatic: Negative.   Skin: Negative.   Musculoskeletal: Positive for back pain.  Gastrointestinal: Positive for constipation.  Genitourinary: Negative.   Neurological: Positive for headaches.  Psychiatric/Behavioral: Negative.   Allergic/Immunologic: Negative.    Please see the history of present illness.     All other systems reviewed and are negative.  EKGs/Labs/Other Studies Reviewed:    The following studies were reviewed today:   EKG:  EKG is  ordered today.  The ekg ordered today demonstrates [facility] normal EKG  Recent Labs: No results found for requested labs within last 8760 hours.  Recent Lipid Panel No results found for: CHOL, TRIG, HDL, CHOLHDL, VLDL, LDLCALC, LDLDIRECT  Physical Exam:    VS:  BP 94/62 (BP Location: Left Arm, Patient Position: Sitting, Cuff Size: Normal)   Pulse (!) 103   Ht  (1.6 m)   Wt 152 lb (68.9 kg)   LMP 07/14/2017   SpO2 99%   BMI 26.93 kg/m     Wt Readings from Last 3 Encounters:  01/10/18 152 lb (68.9 kg)  12/21/17 151 lb 3.2 oz (68.6 kg)  11/22/17 152 lb (68.9 kg)     GEN:  Well nourished, well developed in no acute distress HEENT: Normal NECK: No JVD; No carotid bruits LYMPHATICS: No lymphadenopathy CARDIAC: RRR, no murmurs, rubs,  gallops  Tender left CCJ RESPIRATORY:  Clear to auscultation without rales, wheezing or rhonchi  ABDOMEN: Soft, non-tender, non-distended MUSCULOSKELETAL:  No edema; No deformity  SKIN: Warm and dry NEUROLOGIC:  Alert and oriented x 3 PSYCHIATRIC:  Normal affect     Signed, Norman Herrlich, MD  01/10/2018 3:55 PM    [facility]

## 2018-01-10 ENCOUNTER — Encounter: Payer: Self-pay | Admitting: Cardiology

## 2018-01-10 ENCOUNTER — Ambulatory Visit (INDEPENDENT_AMBULATORY_CARE_PROVIDER_SITE_OTHER): Payer: BLUE CROSS/BLUE SHIELD | Admitting: Cardiology

## 2018-01-10 VITALS — BP 94/62 | HR 103 | Ht 63.0 in | Wt 152.0 lb

## 2018-01-10 DIAGNOSIS — R002 Palpitations: Secondary | ICD-10-CM

## 2018-01-10 DIAGNOSIS — R071 Chest pain on breathing: Secondary | ICD-10-CM | POA: Diagnosis not present

## 2018-01-10 DIAGNOSIS — R0789 Other chest pain: Secondary | ICD-10-CM

## 2018-01-10 NOTE — Patient Instructions (Addendum)
Medication Instructions:  Your physician recommends that you continue on your current medications as directed. Please refer to the Current Medication list given to you today.  Labwork: None  Testing/Procedures: Your physician has requested that you have an echocardiogram. Echocardiography is a painless test that uses sound waves to create images of your heart. It provides your doctor with information about the size and shape of your heart and how well your heart's chambers and valves are working. This procedure takes approximately one hour. There are no restrictions for this procedure.  Follow-Up: Your physician recommends that you schedule a follow-up appointment in: 3-4 weeks  Any Other Special Instructions Will Be Listed Below (If Applicable).     If you need a refill on your cardiac medications before your next appointment, please call your pharmacy.   [facility]  Garey Ham, RN, BSN  Costochondritis Costochondritis is swelling and irritation (inflammation) of the tissue (cartilage) that connects your ribs to your breastbone (sternum). This causes pain in the front of your chest. Usually, the pain:  Starts gradually.  Is in more than one rib.  This condition usually goes away on its own over time. Follow these instructions at home:  Do not do anything that makes your pain worse.  If directed, put ice on the painful area: ? Put ice in a plastic bag. ? Place a towel between your skin and the bag. ? Leave the ice on for 20 minutes, 2-3 times a day.  If directed, put heat on the affected area as often as told by your doctor. Use the heat source that your doctor tells you to use, such as a moist heat pack or a heating pad. ? Place a towel between your skin and the heat source. ? Leave the heat on for 20-30 minutes. ? Take off the heat if your skin turns bright red. This is very important if you cannot feel pain, heat, or cold. You may have a greater risk of getting  burned.  Take over-the-counter and prescription medicines only as told by your doctor.  Return to your normal activities as told by your doctor. Ask your doctor what activities are safe for you.  Keep all follow-up visits as told by your doctor. This is important. Contact a doctor if:  You have chills or a fever.  Your pain does not go away or it gets worse.  You have a cough that does not go away. Get help right away if:  You are short of breath. This information is not intended to replace advice given to you by your health care provider. Make sure you discuss any questions you have with your health care provider. Document Released: 01/24/2008 Document Revised: 02/25/2016 Document Reviewed: 12/01/2015 Elsevier Interactive Patient Education  2018 [employer]. [place] [email or web address].com/[email or web address]        FDA-cleared, clinical grade mobile EKG monitor: Lourena Simmonds is the most clinically-validated mobile EKG used by the world's leading cardiac care medical professionals With Basic service, know instantly if your heart rhythm is normal or if atrial fibrillation is detected, and email the last single EKG recording to yourself or your doctor Premium service, available for purchase through the Kardia app for $9.99 per month or $99 per year, includes unlimited history and storage of your EKG recordings, a monthly EKG summary report to share with your doctor, along with the ability to track your blood pressure, activity and weight Includes one KardiaMobile phone clip FREE SHIPPING: Standard delivery 1-3 business days. Orders placed by  11:00am PST will ship that afternoon. Otherwise, will ship next business day. All orders ship via [employer] from [place], [place]  1. Avoid all over-the-counter antihistamines except Claritin/Loratadine and Zyrtec/Cetrizine. 2. Avoid all combination including cold sinus allergies flu decongestant and sleep medications 3. You can use  Robitussin DM Mucinex and Mucinex DM for cough. 4. can use Tylenol aspirin ibuprofen and naproxen but no combinations such as sleep or sinus.

## 2018-01-17 DIAGNOSIS — Z3480 Encounter for supervision of other normal pregnancy, unspecified trimester: Secondary | ICD-10-CM | POA: Diagnosis not present

## 2018-01-17 DIAGNOSIS — Z23 Encounter for immunization: Secondary | ICD-10-CM | POA: Diagnosis not present

## 2018-01-18 ENCOUNTER — Ambulatory Visit (HOSPITAL_COMMUNITY)
Admission: RE | Admit: 2018-01-18 | Discharge: 2018-01-18 | Disposition: A | Discharge status: Home / Self Care | Payer: BLUE CROSS/BLUE SHIELD | Source: Ambulatory Visit | Attending: Obstetrics and Gynecology | Admitting: Obstetrics and Gynecology

## 2018-01-18 ENCOUNTER — Other Ambulatory Visit (HOSPITAL_COMMUNITY): Payer: Self-pay | Admitting: *Deleted

## 2018-01-18 ENCOUNTER — Encounter (HOSPITAL_COMMUNITY): Payer: Self-pay

## 2018-01-18 DIAGNOSIS — O289 Unspecified abnormal findings on antenatal screening of mother: Secondary | ICD-10-CM | POA: Diagnosis present

## 2018-01-18 DIAGNOSIS — O3510X0 Maternal care for (suspected) chromosomal abnormality in fetus, unspecified, not applicable or unspecified: Secondary | ICD-10-CM

## 2018-01-18 DIAGNOSIS — O351XX0 Maternal care for (suspected) chromosomal abnormality in fetus, not applicable or unspecified: Secondary | ICD-10-CM

## 2018-01-18 DIAGNOSIS — O285 Abnormal chromosomal and genetic finding on antenatal screening of mother: Secondary | ICD-10-CM | POA: Insufficient documentation

## 2018-01-18 DIAGNOSIS — Z3A26 26 weeks gestation of pregnancy: Secondary | ICD-10-CM | POA: Insufficient documentation

## 2018-01-18 IMAGING — US US MFM OB FOLLOW-UP
1 series · 14 of 28 positions shown · non-contrast
Comparison: none

[Series 1: us mfm ob follow-up · 76 acquisitions, 14 frames shown]
[im 3/76]
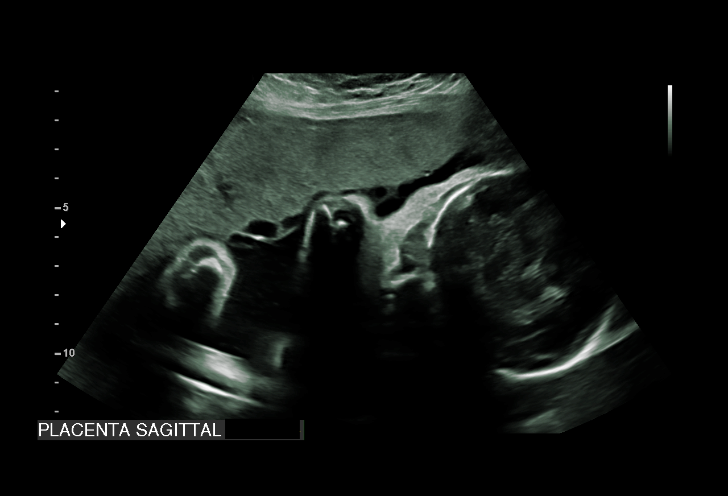
[im 9/76]
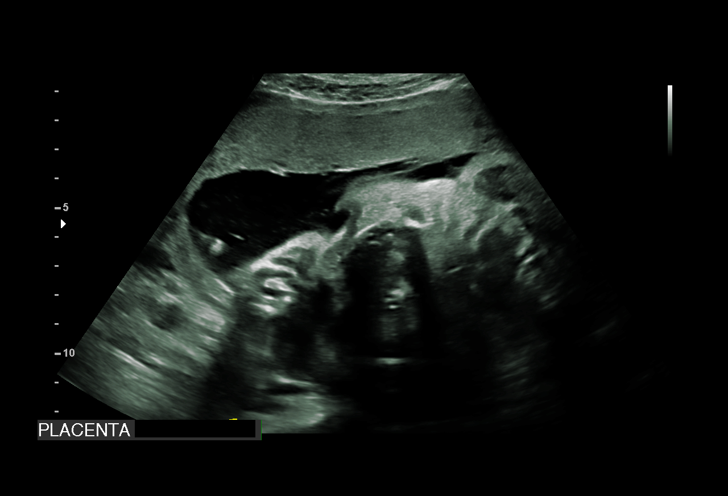
[im 14/76]
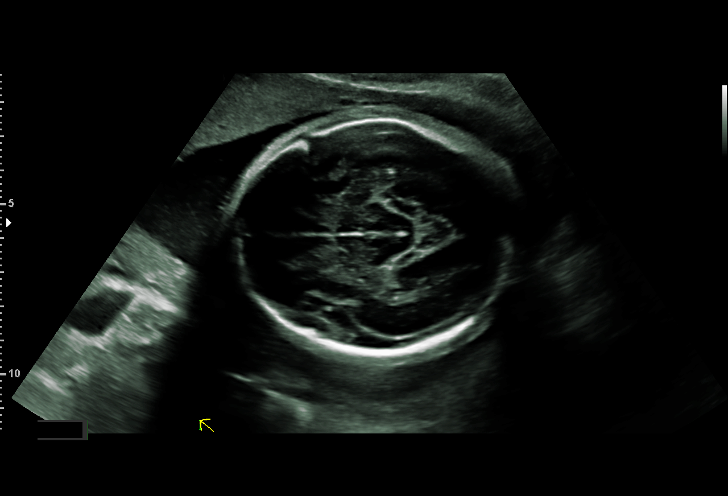
[im 20/76]
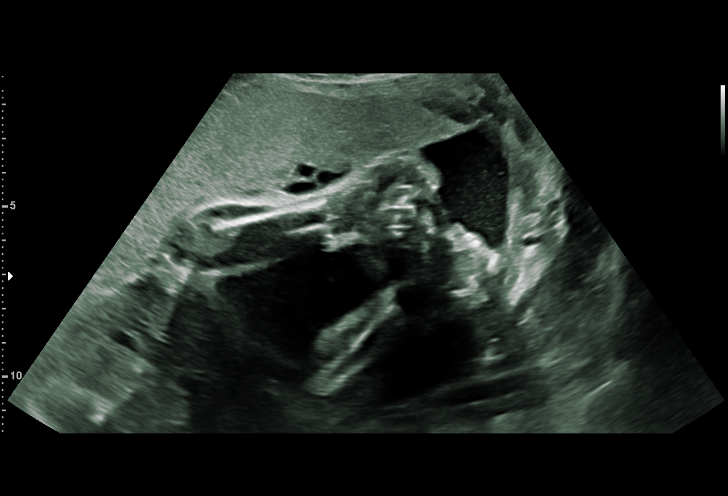
[im 26/76]
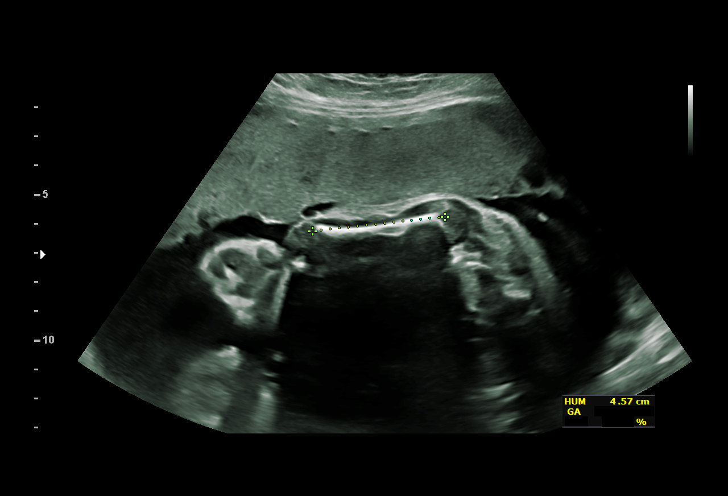
[im 31/76]
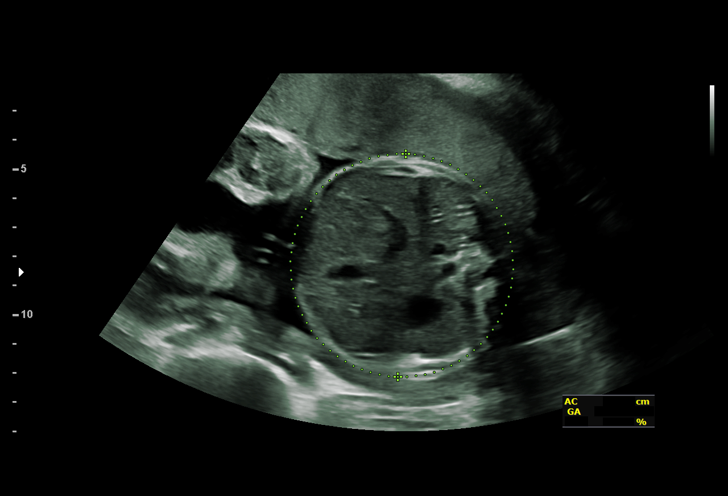
[im 37/76]
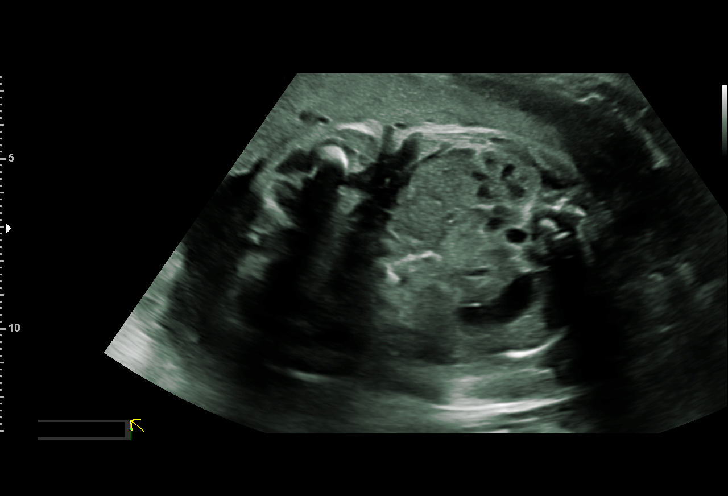
[im 42/76]
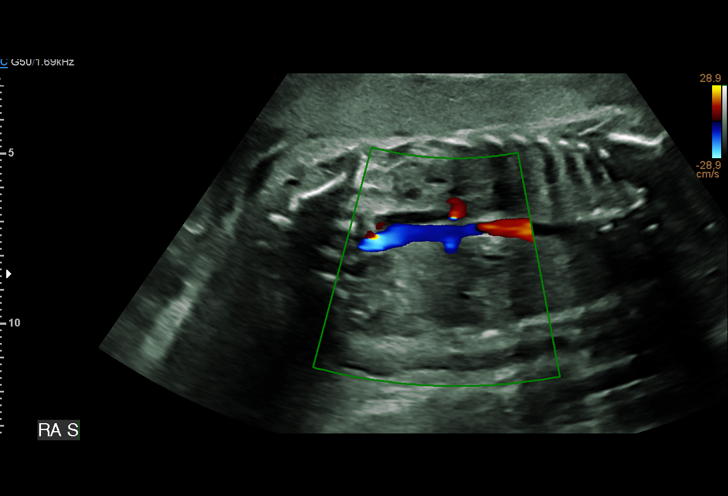
[im 48/76]
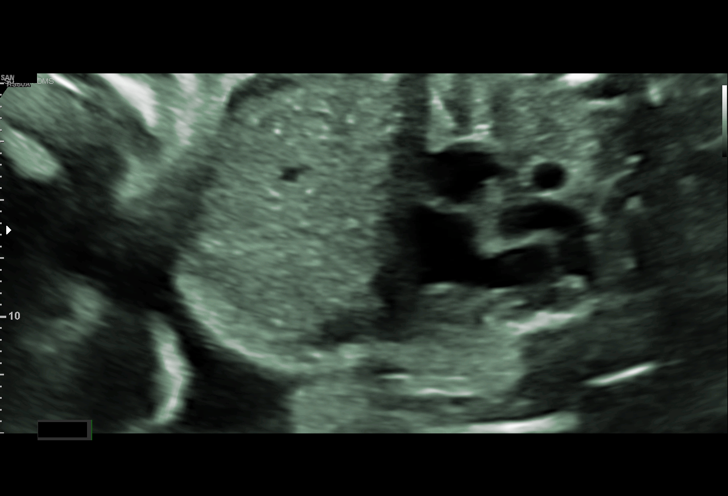
[im 53/76]
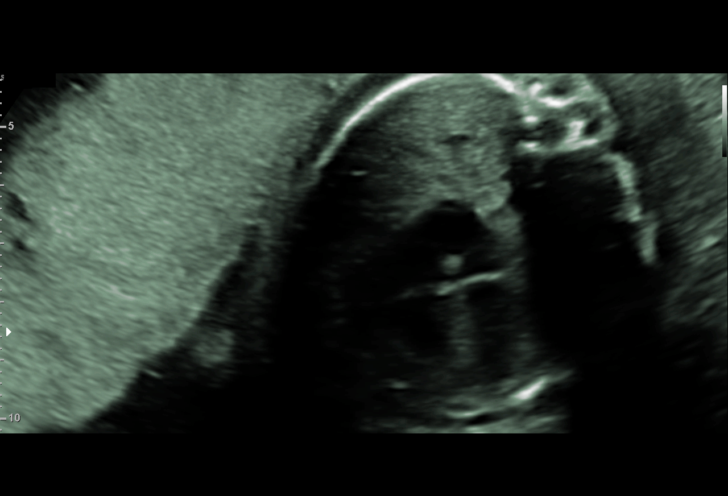
[im 59/76]
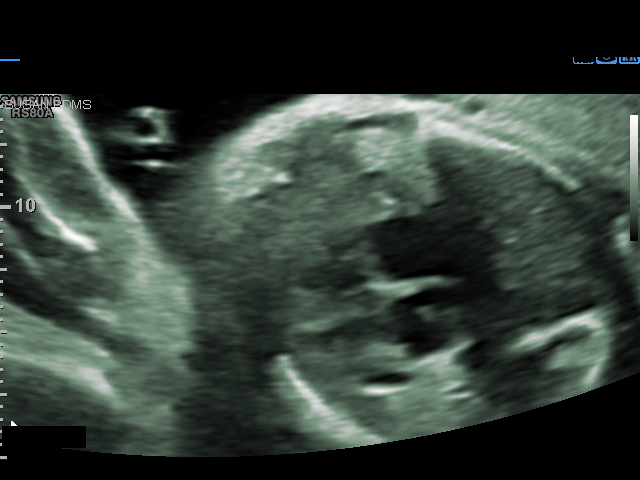
[im 64/76]
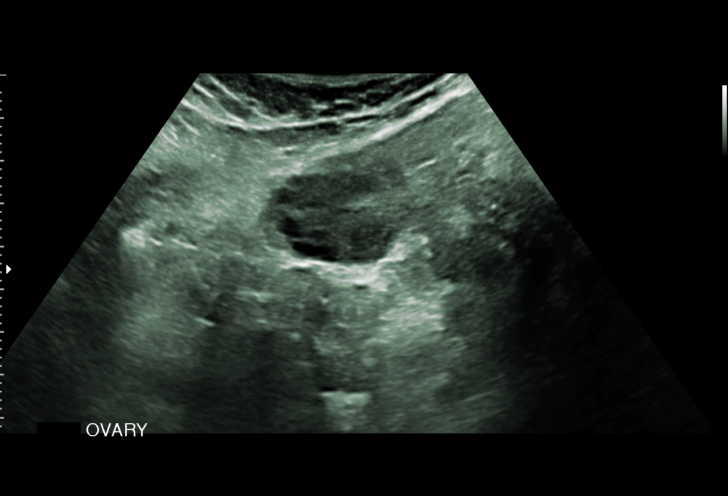
[im 70/76]
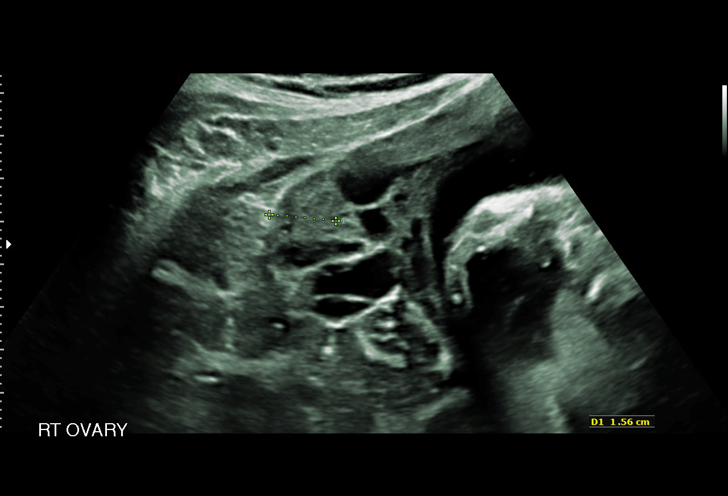
[im 76/76]
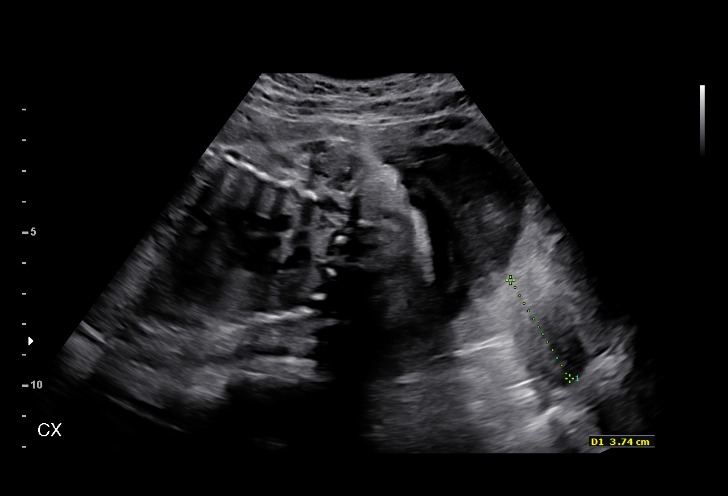

[14 of 28 positions shown; findings below may reference images not displayed]

1  O-O              [PHONE_NUMBER]      [PHONE_NUMBER]     [PHONE_NUMBER]
Indications

26 weeks gestation of pregnancy
Advanced maternal age multigravida (41),       [HS]
second trimester-High risk NIPS
Abnormal fetal ultrasound ( NT 3.6mm)          [HS]
Abnormal chromosomal and genetic finding       [HS]
on antenatal screening of mother (T21 by
NIPS)
Encounter for other antenatal screening        [HS]
follow-up

OB History

Gravidity:    4          SAB:   2
TOP:          1
Fetal Evaluation

Num Of Fetuses:     1
Cardiac Activity:   Observed
Presentation:       Cephalic
Placenta:           Anterior, above cervical os
P. Cord Insertion:  Visualized, central

Amniotic Fluid
AFI FV:      Subjectively within normal limits

Largest Pocket(cm)
5.8
Biometry

BPD:      69.3  mm     G. Age:  27w 6d         73  %    CI:        78.96   %    70 - 86
FL/HC:      19.9   %    18.6 -
HC:      246.6  mm     G. Age:  26w 5d         21  %    HC/AC:      1.02        1.05 -
AC:      242.7  mm     G. Age:  28w 4d         87  %    FL/BPD:     70.7   %    71 - 87
FL:         49  mm     G. Age:  26w 4d         25  %    FL/AC:      20.2   %    20 - 24
HUM:      45.4  mm     G. Age:  26w 6d         46  %
CER:      31.1  mm     G. Age:  27w 1d         56  %
LV:        4.3  mm

Est. FW:    [HS]  gm      2 lb 7 oz     70  %
Gestational Age

LMP:           26w 6d        Date:  [DATE]                 EDD:   [DATE]
U/S Today:     27w 3d                                        EDD:   [DATE]
Best:          26w 6d     Det. By:  LMP  ([DATE])          EDD:   [DATE]
Anatomy

Cranium:               Appears normal         Aortic Arch:            Previously seen
Cavum:                 Appears normal         Ductal Arch:            Previously seen
Ventricles:            Appears normal         Diaphragm:              Appears normal
Choroid Plexus:        Previously seen        Stomach:                Appears normal, left
sided
Cerebellum:            Appears normal         Abdomen:                Appears normal
Posterior Fossa:       Appears normal         Abdominal Wall:         Appears nml (cord
insert, abd wall)
Nuchal Fold:           Previously seen        Cord Vessels:           Appears normal (3
vessel cord)
Face:                  Appears normal         Kidneys:                Appear normal
(orbits and profile)
Lips:                  Appears normal         Bladder:                Appears normal
Thoracic:              Appears normal         Spine:                  Appears normal
Heart:                 Appears normal         Upper Extremities:      Previously seen
(4CH, axis, and
situs)
RVOT:                  Appears normal         Lower Extremities:      Previously seen
LVOT:                  Appears normal

Other:  Fetus appears to be a female. Heels and 5th digit prev. visualized.
Technically difficult due to fetal position.
Cervix Uterus Adnexa

Cervix
Length:            3.7  cm.
Normal appearance by transabdominal scan.

Left Ovary
Within normal limits.

Right Ovary
Within normal limits.
Impression

Single living intrauterine pregnancy at 26w 6d.
Cephalic presentation.
Placenta Anterior, above cervical os.
Normal amniotic fluid volume.
Appropriate interval fetal growth.
No defects demonstrated on interval fetal anatomy survey,
noting good views of the 4 chamber heart without apparent
defect
NIPS positive screen for T21
Recommendations

Recommend follow-up ultrasound examination in 4 weeks for
growth.
Follow up fetal echocardiogram as scheduled.

## 2018-01-22 ENCOUNTER — Ambulatory Visit (HOSPITAL_BASED_OUTPATIENT_CLINIC_OR_DEPARTMENT_OTHER)
Admission: RE | Admit: 2018-01-22 | Discharge: 2018-01-22 | Disposition: A | Discharge status: Home / Self Care | Payer: BLUE CROSS/BLUE SHIELD | Source: Ambulatory Visit | Attending: Cardiology | Admitting: Cardiology

## 2018-01-22 DIAGNOSIS — R002 Palpitations: Secondary | ICD-10-CM | POA: Diagnosis not present

## 2018-01-22 DIAGNOSIS — R079 Chest pain, unspecified: Secondary | ICD-10-CM | POA: Insufficient documentation

## 2018-01-22 NOTE — Progress Notes (Signed)
Echocardiogram 2D Echocardiogram has been performed.  Dorothey BasemanReel, Kennieth Plotts M 01/22/2018, 3:43 PM

## 2018-01-30 NOTE — Progress Notes (Signed)
Cardiology Office Note:    Date:  01/31/2018   ID:  Stephanie Watts, DOB 06-Oct-1975, MRN [id number]  PCP:  Shaune PollackGates, Donna, MD  Cardiologist:  Norman HerrlichBrian Divine Imber, MD    Referring MD: Shaune PollackGates, Donna, MD    ASSESSMENT:    1. Costochondral chest pain   2. Palpitations    PLAN:    In order of problems listed above:  1. She is improved chest pain and chest wall tenderness have resolved and she is no longer doing repetitive lifting at work.  I do not think she requires any pharmacologic therapy or physical therapy modalities 2. Improved no recurrence   Next appointment: As needed   Medication Adjustments/Labs and Tests Ordered: Current medicines are reviewed at length with the patient today.  Concerns regarding medicines are outlined above.  No orders of the defined types were placed in this encounter.  No orders of the defined types were placed in this encounter.   Chief Complaint  Patient presents with  . Follow-up    History of Present Illness:    Stephanie Watts is a 42 y.o. female with a hx of chest pain last seen 01/10/18.   ASSESSMENT:    01/10/18   1. Costochondral chest pain   2. Palpitations    PLAN:    1. Clinically she has chronic or recurrent costochondral chest pain.  For reassurance further evaluations of the cardiac echo performed.  I asked her to abstain from lifting boxes at work which may be provocative.  I do not think she requires any pharmacologic therapy and she is quite reassured. 2. Recently flared with over-the-counter decongestant she will abstain at this time after discussion she does not feel she requires a repeat ambulatory event monitor will contact me if the symptoms do not improve or worsen  Compliance with diet, lifestyle and medications: Yes  She returns after cardiac echo which is normal.  She is abstain from lifting boxes at work and chest pain costochondral as resolved.  She is not having palpitation.  This time I do not think she requires  any further cardiac evaluation or follow-up. Past Medical History:  Diagnosis Date  . [redacted] weeks gestation of pregnancy   . Allergic rhinitis   . Chest pain with low risk for cardiac etiology 05/06/2016  . Hx of herpes genitalis   . Migraine headache   . Occipital headache   . Palpitations 05/06/2016  . Suspected chromosome anomaly of fetus affecting management of mother in singleton pregnancy, antepartum 11/22/2017   T21 risk on Panorama, patient would like postnatal chromosome analysis for baby    Past Surgical History:  Procedure Laterality Date  . BUNIONECTOMY    . FACIAL RECONSTRUCTION SURGERY      Current Medications: Current Meds  Medication Sig  . acetaminophen (TYLENOL) 325 MG tablet Take 650 mg by mouth every 6 (six) hours as needed.  . Prenatal Multivit-Min-Fe-FA (PRENATAL VITAMINS PO) Take 1 tablet by mouth daily.  . valACYclovir (VALTREX) 500 MG tablet Take 500 mg by mouth as needed.     Allergies:   Patient has no known allergies.   Social History   Socioeconomic History  . Marital status: Married    Spouse name: Not on file  . Number of children: Not on file  . Years of education: Not on file  . Highest education level: Not on file  Occupational History  . Not on file  Social Needs  . Financial resource strain: Not on file  .  Food insecurity:    Worry: Not on file    Inability: Not on file  . Transportation needs:    Medical: Not on file    Non-medical: Not on file  Tobacco Use  . Smoking status: Former Smoker    Types: Cigarettes  . Smokeless tobacco: Never Used  . Tobacco comment: every 6 mths or so  Substance and Sexual Activity  . Alcohol use: Not Currently    Alcohol/week: 0.6 - 1.2 oz    Types: 1 - 2 Glasses of wine per week  . Drug use: No  . Sexual activity: Yes    Birth control/protection: None  Lifestyle  . Physical activity:    Days per week: Not on file    Minutes per session: Not on file  . Stress: Not on file  Relationships  .  Social connections:    Talks on phone: Not on file    Gets together: Not on file    Attends religious service: Not on file    Active member of club or organization: Not on file    Attends meetings of clubs or organizations: Not on file    Relationship status: Not on file  Other Topics Concern  . Not on file  Social History Narrative  . Not on file     Family History: The patient's family history includes Congestive Heart Failure in her paternal grandfather; Healthy in her brother and sister; High Cholesterol in her father; Hypertension in her father and mother. ROS:   Please see the history of present illness.    All other systems reviewed and are negative.  EKGs/Labs/Other Studies Reviewed:    The following studies were reviewed today:  Echo 01/22/18: Left ventricle: The cavity size was normal. Systolic function was normal. The estimated ejection fraction was in the range of 55% to 60%. Wall motion was normal; there were no regional wall motion abnormalities. Doppler parameters are consistent with abnormal left ventricular relaxation (grade 1 diastolic dysfunction).  Recent Labs: No results found for requested labs within last 8760 hours.  Recent Lipid Panel No results found for: CHOL, TRIG, HDL, CHOLHDL, VLDL, LDLCALC, LDLDIRECT  Physical Exam:    VS:  BP 104/66 (BP Location: Right Arm, Patient Position: Sitting, Cuff Size: Normal)   Pulse (!) 115   Ht 5\' 3"  (1.6 m)   Wt 152 lb 12.8 oz (69.3 kg)   LMP 07/14/2017   SpO2 99%   BMI 27.07 kg/m     Wt Readings from Last 3 Encounters:  01/31/18 152 lb 12.8 oz (69.3 kg)  01/18/18 152 lb 8 oz (69.2 kg)  01/10/18 152 lb (68.9 kg)     GEN:  Well nourished, well developed in no acute distress HEENT: Normal NECK: No JVD; No carotid bruits LYMPHATICS: No lymphadenopathy CARDIAC: RRR, no murmurs, rubs, gallops RESPIRATORY:  Clear to auscultation without rales, wheezing or rhonchi  ABDOMEN: Soft, non-tender,  non-distended MUSCULOSKELETAL:  No edema; No deformity  SKIN: Warm and dry NEUROLOGIC:  Alert and oriented x 3 PSYCHIATRIC:  Normal affect    Signed, Norman Herrlich, MD  01/31/2018 4:47 PM    [facility]

## 2018-01-31 ENCOUNTER — Ambulatory Visit (INDEPENDENT_AMBULATORY_CARE_PROVIDER_SITE_OTHER): Payer: BLUE CROSS/BLUE SHIELD | Admitting: Cardiology

## 2018-01-31 ENCOUNTER — Encounter: Payer: Self-pay | Admitting: Cardiology

## 2018-01-31 VITALS — BP 104/66 | HR 115 | Ht 63.0 in | Wt 152.8 lb

## 2018-01-31 DIAGNOSIS — R071 Chest pain on breathing: Secondary | ICD-10-CM | POA: Diagnosis not present

## 2018-01-31 DIAGNOSIS — R0789 Other chest pain: Secondary | ICD-10-CM

## 2018-01-31 DIAGNOSIS — R002 Palpitations: Secondary | ICD-10-CM | POA: Diagnosis not present

## 2018-01-31 NOTE — Patient Instructions (Addendum)
Medication Instructions:  Your physician recommends that you continue on your current medications as directed. Please refer to the Current Medication list given to you today.  Labwork: None  Testing/Procedures: None  Follow-Up: Your physician recommends that you schedule a follow-up appointment as needed if symptoms worsen or fail to improve.   If you need a refill on your cardiac medications before your next appointment, please call your pharmacy.   Thank you for choosing [facility]! Artia Singley, RN [phone]    

## 2018-02-01 DIAGNOSIS — O351XX0 Maternal care for (suspected) chromosomal abnormality in fetus, not applicable or unspecified: Secondary | ICD-10-CM | POA: Diagnosis not present

## 2018-02-01 DIAGNOSIS — Z3A28 28 weeks gestation of pregnancy: Secondary | ICD-10-CM | POA: Diagnosis not present

## 2018-02-14 ENCOUNTER — Other Ambulatory Visit (HOSPITAL_COMMUNITY): Payer: Self-pay | Admitting: Obstetrics and Gynecology

## 2018-02-14 ENCOUNTER — Encounter (HOSPITAL_COMMUNITY): Payer: Self-pay

## 2018-02-14 ENCOUNTER — Ambulatory Visit (HOSPITAL_COMMUNITY)
Admission: RE | Admit: 2018-02-14 | Discharge: 2018-02-14 | Disposition: A | Discharge status: Home / Self Care | Payer: BLUE CROSS/BLUE SHIELD | Source: Ambulatory Visit | Attending: Obstetrics and Gynecology | Admitting: Obstetrics and Gynecology

## 2018-02-14 DIAGNOSIS — O09523 Supervision of elderly multigravida, third trimester: Secondary | ICD-10-CM | POA: Diagnosis not present

## 2018-02-14 DIAGNOSIS — O285 Abnormal chromosomal and genetic finding on antenatal screening of mother: Secondary | ICD-10-CM | POA: Insufficient documentation

## 2018-02-14 DIAGNOSIS — Z362 Encounter for other antenatal screening follow-up: Secondary | ICD-10-CM

## 2018-02-14 DIAGNOSIS — Z3A30 30 weeks gestation of pregnancy: Secondary | ICD-10-CM | POA: Diagnosis not present

## 2018-02-14 DIAGNOSIS — O289 Unspecified abnormal findings on antenatal screening of mother: Secondary | ICD-10-CM

## 2018-02-14 DIAGNOSIS — O351XX0 Maternal care for (suspected) chromosomal abnormality in fetus, not applicable or unspecified: Secondary | ICD-10-CM | POA: Diagnosis not present

## 2018-02-14 DIAGNOSIS — O3510X0 Maternal care for (suspected) chromosomal abnormality in fetus, unspecified, not applicable or unspecified: Secondary | ICD-10-CM

## 2018-02-14 IMAGING — US US MFM OB FOLLOW-UP
1 series · 14 of 28 positions shown · non-contrast
Comparison: none

[Series 1: us mfm ob follow-up · 14 of 41 slices shown]
[im 2/41]
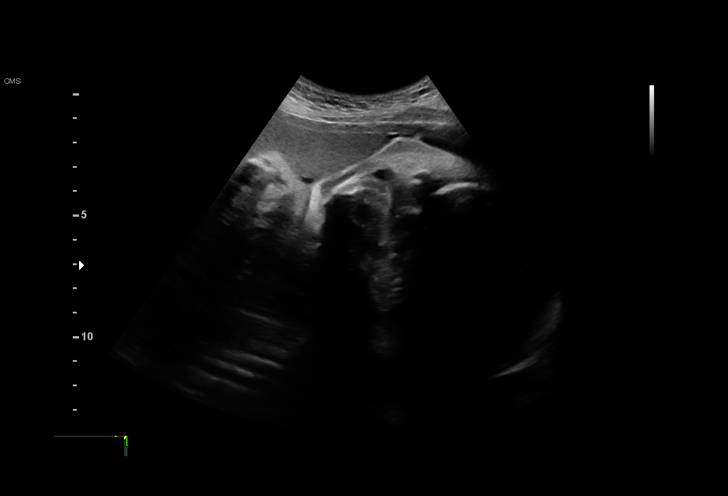
[im 5/41]
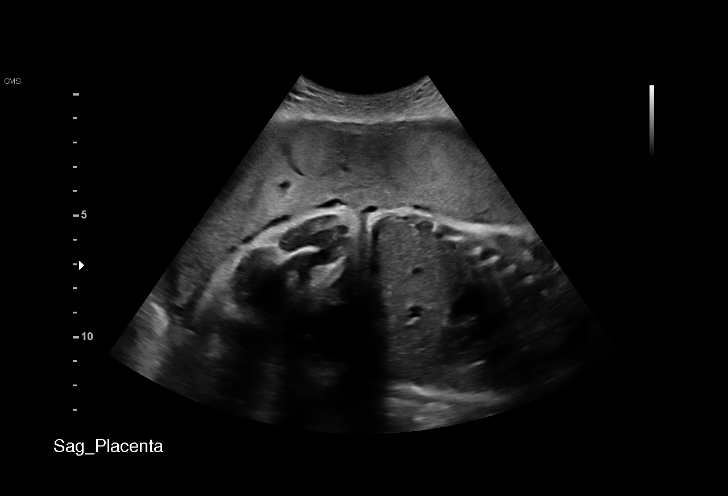
[im 8/41]
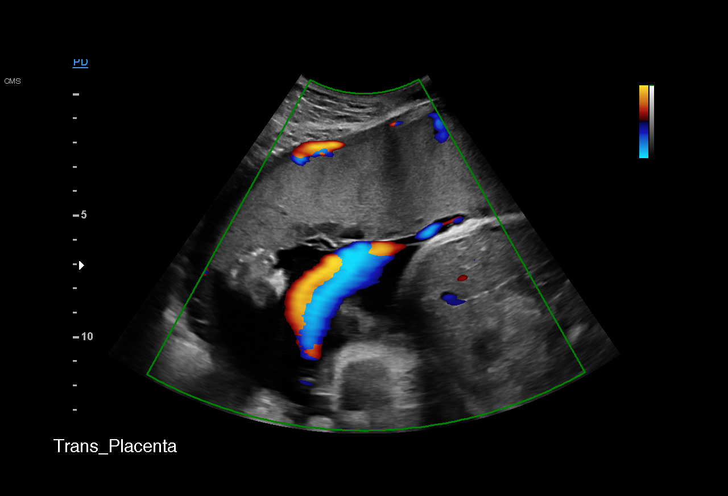
[im 11/41]
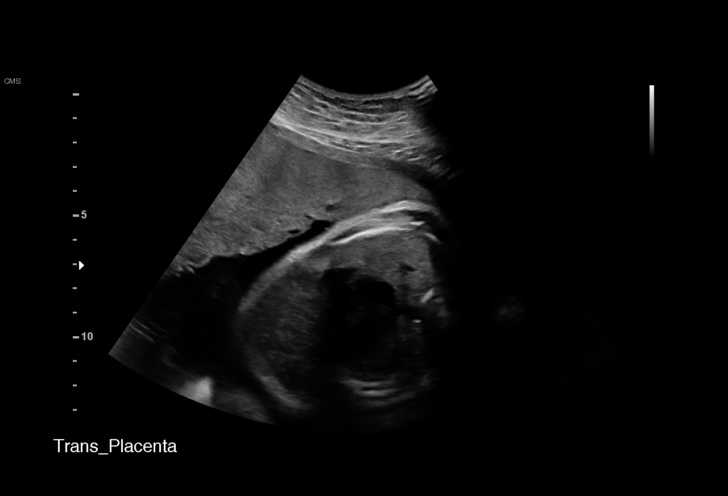
[im 14/41]
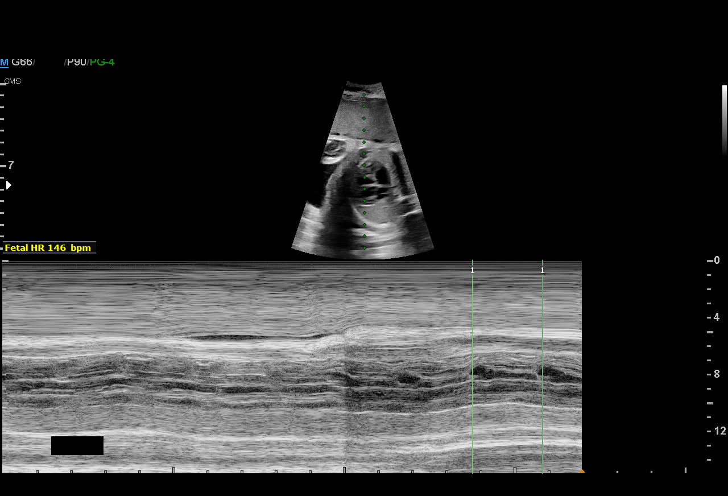
[im 17/41]
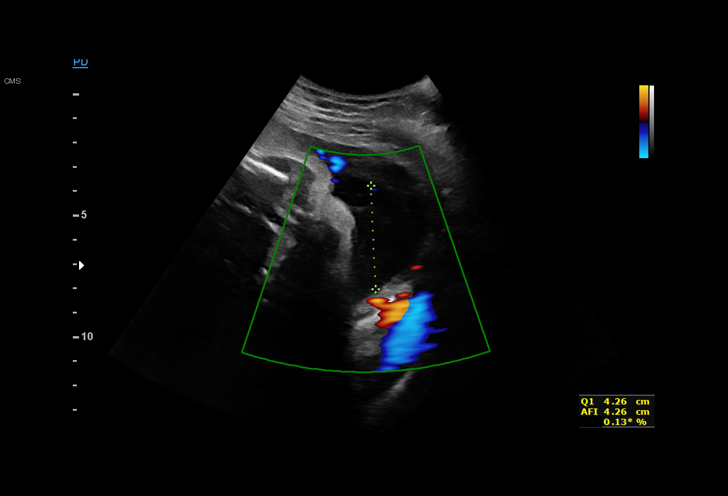
[im 20/41]
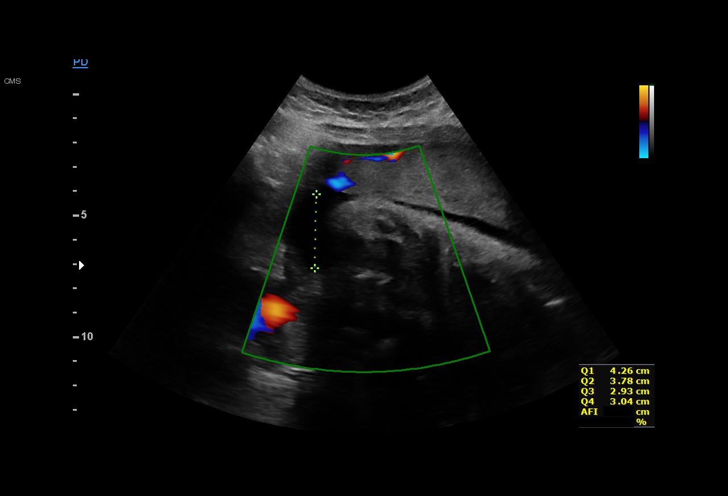
[im 23/41]
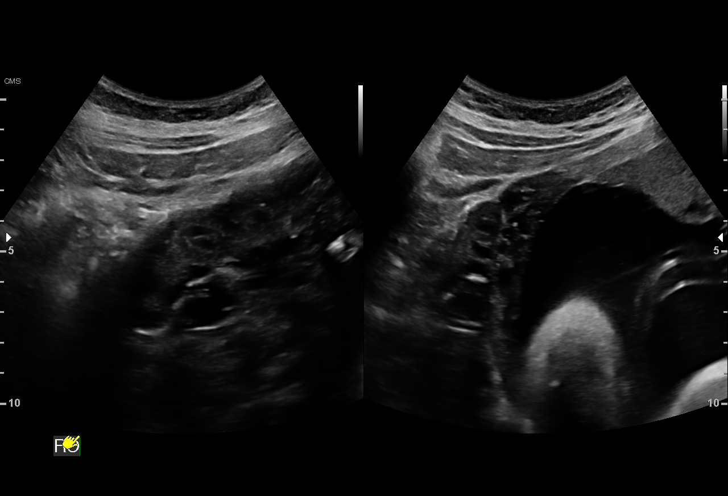
[im 26/41]
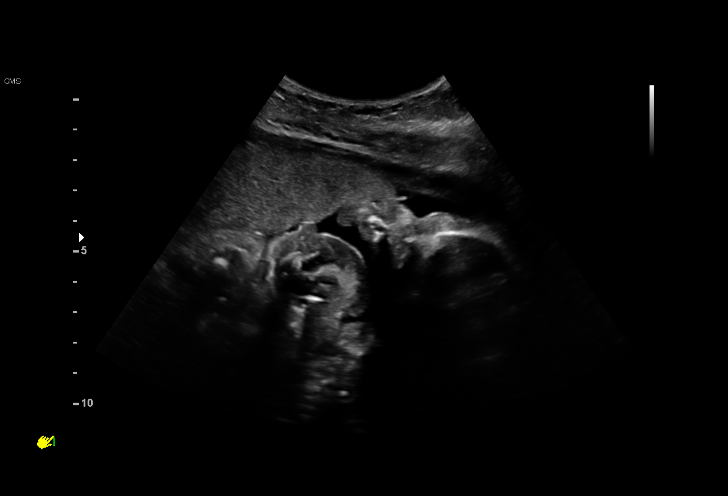
[im 29/41]
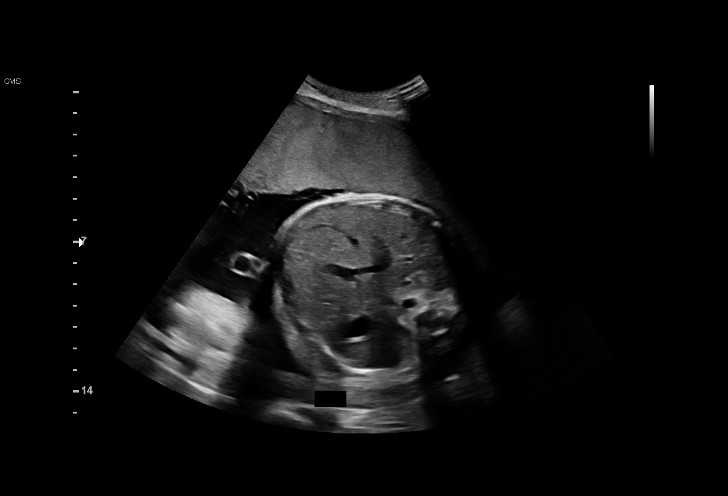
[im 32/41]
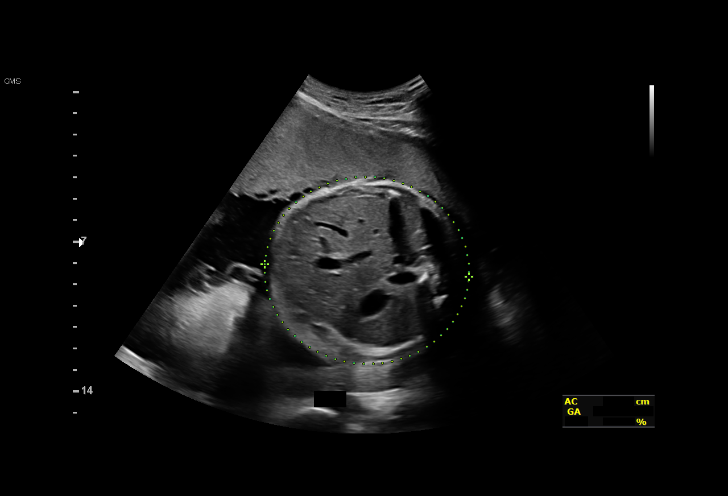
[im 35/41]
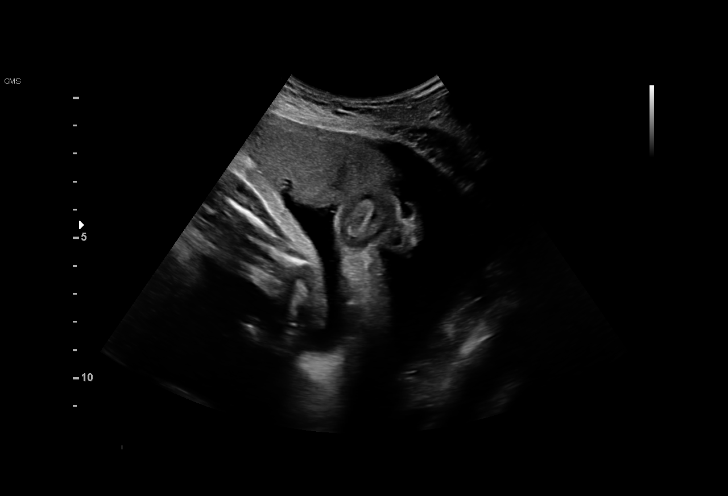
[im 38/41]
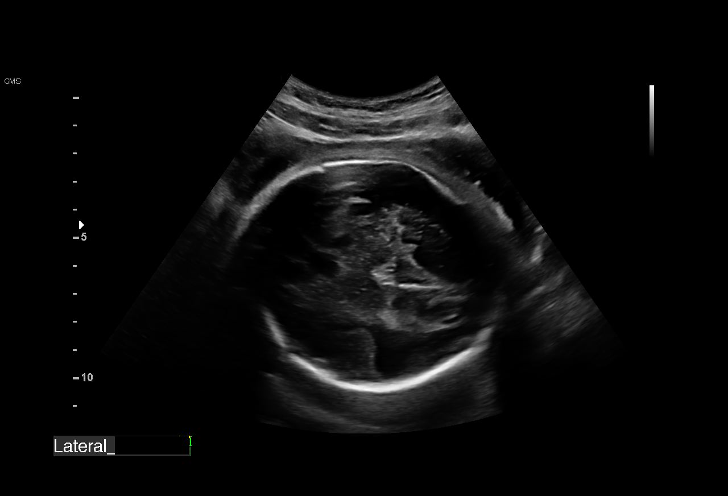
[im 41/41]
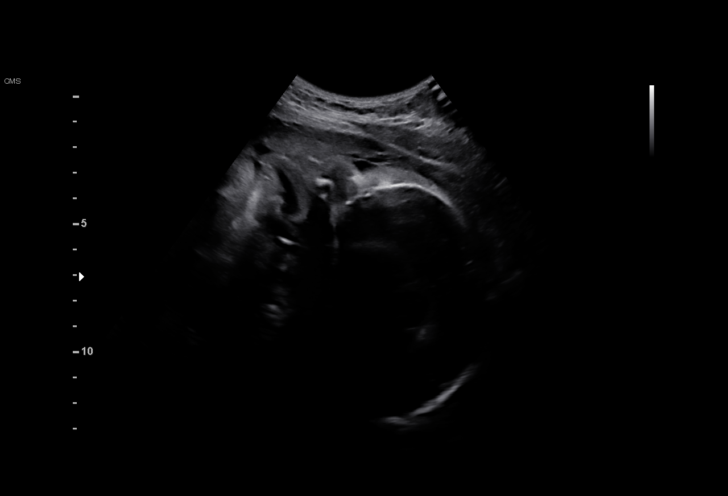

[14 of 28 positions shown; findings below may reference images not displayed]

Indications

30 weeks gestation of pregnancy
Advanced maternal age multigravida 35+,        [6G]
third trimester (T21 by NIPS)
Abnormal fetal ultrasound ( NT 3.6mm)          [6G]
Abnormal chromosomal and genetic finding       [6G]
on antenatal screening of mother (T21 by
NIPS)
Encounter for other antenatal screening        [6G]
follow-up
OB History

Gravidity:    4          SAB:   2
TOP:          1
Fetal Evaluation

Num Of Fetuses:     1
Fetal Heart         146
Rate(bpm):
Cardiac Activity:   Observed
Presentation:       Cephalic
Placenta:           Anterior, above cervical os
P. Cord Insertion:  Visualized

Amniotic Fluid
AFI FV:      Subjectively within normal limits

AFI Sum(cm)     %Tile       Largest Pocket(cm)
14.01           47

RUQ(cm)       RLQ(cm)       LUQ(cm)        LLQ(cm)
4.26
Biometry

BPD:      80.5  mm     G. Age:  32w 2d         84  %    CI:        75.13   %    70 - 86
FL/HC:      19.1   %    19.3 -
HC:      294.6  mm     G. Age:  32w 4d         65  %    HC/AC:      1.00        0.96 -
AC:      294.5  mm     G. Age:  33w 3d       > 97  %    FL/BPD:     69.9   %    71 - 87
FL:       56.3  mm     G. Age:  29w 4d         11  %    FL/AC:      19.1   %    20 - 24
HUM:      49.1  mm     G. Age:  29w 0d         13  %

Est. FW:    [6G]  gm      4 lb 3 oz     78  %
Gestational Age

LMP:           30w 5d        Date:  [DATE]                 EDD:   [DATE]
U/S Today:     32w 0d                                        EDD:   [DATE]
Best:          30w 5d     Det. By:  LMP  ([DATE])          EDD:   [DATE]
Anatomy

Cranium:               Appears normal         Aortic Arch:            Previously seen
Cavum:                 Previously seen        Ductal Arch:            Previously seen
Ventricles:            Appears normal         Diaphragm:              Appears normal
Choroid Plexus:        Previously seen        Stomach:                Appears normal, left
sided
Cerebellum:            Previously seen        Abdomen:                Appears normal
Posterior Fossa:       Previously seen        Abdominal Wall:         Previously seen
Nuchal Fold:           Previously seen        Cord Vessels:           Previously seen
Face:                  Appears normal         Kidneys:                Appear normal
(orbits and profile)
Lips:                  Appears normal         Bladder:                Appears normal
Thoracic:              Appears normal         Spine:                  Previously seen
Heart:                 Variant, see           Upper Extremities:      Previously seen
comments
RVOT:                  Appears normal         Lower Extremities:      Previously seen
LVOT:                  Appears normal

Other:  Female gender. Heels and 5th digit previously visualized. Technically
difficult due to fetal position.
Cervix Uterus Adnexa

Cervix
Not visualized (advanced GA >[6G])

Uterus
No abnormality visualized.

Left Ovary
Within normal limits.

Right Ovary
Within normal limits.

Cul De Sac:   No free fluid seen.

Adnexa:       No abnormality visualized.
Impression

Single living intrauterine pregnancy at 30w 5d.
Cephalic presentation.
Placenta Anterior, above cervical os.
Normal amniotic fluid volume.
Appropriate interval fetal growth in the 78th percentile
No defects demonstrated on interval fetal anatomy survey,
unable to appreciate the AV canal defect
Recommendations

Recommend follow-up ultrasound examination in 4 weeks

## 2018-02-15 ENCOUNTER — Ambulatory Visit (HOSPITAL_COMMUNITY): Payer: BLUE CROSS/BLUE SHIELD

## 2018-02-26 DIAGNOSIS — O368330 Maternal care for abnormalities of the fetal heart rate or rhythm, third trimester, not applicable or unspecified: Secondary | ICD-10-CM | POA: Diagnosis not present

## 2018-02-26 DIAGNOSIS — Z3A32 32 weeks gestation of pregnancy: Secondary | ICD-10-CM | POA: Diagnosis not present

## 2018-02-26 DIAGNOSIS — O3663X0 Maternal care for excessive fetal growth, third trimester, not applicable or unspecified: Secondary | ICD-10-CM | POA: Diagnosis not present

## 2018-03-04 DIAGNOSIS — O368330 Maternal care for abnormalities of the fetal heart rate or rhythm, third trimester, not applicable or unspecified: Secondary | ICD-10-CM | POA: Diagnosis not present

## 2018-03-12 DIAGNOSIS — Z3A34 34 weeks gestation of pregnancy: Secondary | ICD-10-CM | POA: Diagnosis not present

## 2018-03-12 DIAGNOSIS — N760 Acute vaginitis: Secondary | ICD-10-CM | POA: Diagnosis not present

## 2018-03-12 DIAGNOSIS — O368330 Maternal care for abnormalities of the fetal heart rate or rhythm, third trimester, not applicable or unspecified: Secondary | ICD-10-CM | POA: Diagnosis not present

## 2018-03-14 ENCOUNTER — Other Ambulatory Visit (HOSPITAL_COMMUNITY): Payer: Self-pay | Admitting: Obstetrics and Gynecology

## 2018-03-14 ENCOUNTER — Ambulatory Visit (HOSPITAL_COMMUNITY)
Admission: RE | Admit: 2018-03-14 | Discharge: 2018-03-14 | Disposition: A | Discharge status: Home / Self Care | Payer: BLUE CROSS/BLUE SHIELD | Source: Ambulatory Visit | Attending: Obstetrics and Gynecology | Admitting: Obstetrics and Gynecology

## 2018-03-14 ENCOUNTER — Encounter (HOSPITAL_COMMUNITY): Payer: Self-pay

## 2018-03-14 DIAGNOSIS — O3510X0 Maternal care for (suspected) chromosomal abnormality in fetus, unspecified, not applicable or unspecified: Secondary | ICD-10-CM

## 2018-03-14 DIAGNOSIS — O351XX0 Maternal care for (suspected) chromosomal abnormality in fetus, not applicable or unspecified: Secondary | ICD-10-CM

## 2018-03-14 DIAGNOSIS — O285 Abnormal chromosomal and genetic finding on antenatal screening of mother: Secondary | ICD-10-CM

## 2018-03-14 DIAGNOSIS — O09523 Supervision of elderly multigravida, third trimester: Secondary | ICD-10-CM | POA: Diagnosis not present

## 2018-03-14 DIAGNOSIS — O289 Unspecified abnormal findings on antenatal screening of mother: Secondary | ICD-10-CM | POA: Diagnosis not present

## 2018-03-14 DIAGNOSIS — Z362 Encounter for other antenatal screening follow-up: Secondary | ICD-10-CM

## 2018-03-14 DIAGNOSIS — Z3A34 34 weeks gestation of pregnancy: Secondary | ICD-10-CM

## 2018-03-14 IMAGING — US US MFM OB FOLLOW-UP
1 series · 14 of 28 positions shown · non-contrast
Comparison: none

[Series 1: us mfm ob follow-up · 81 acquisitions, 14 frames shown]
[im 3/81]
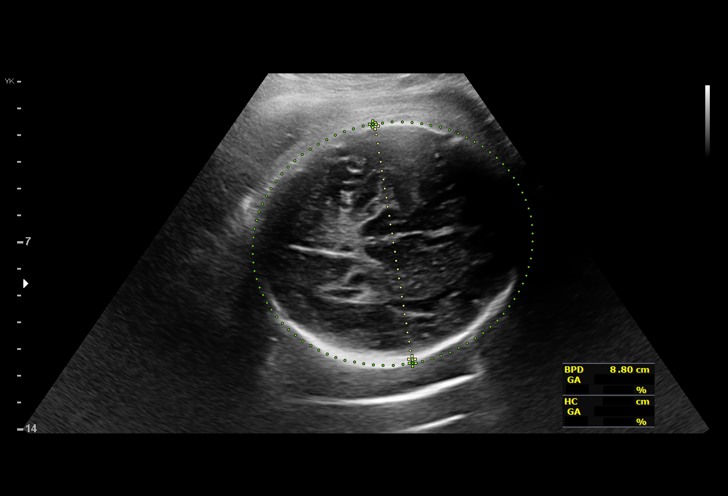
[im 9/81]
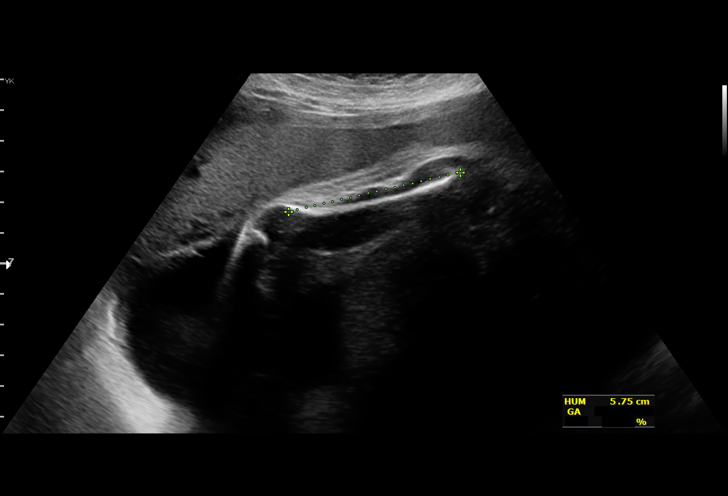
[im 15/81]
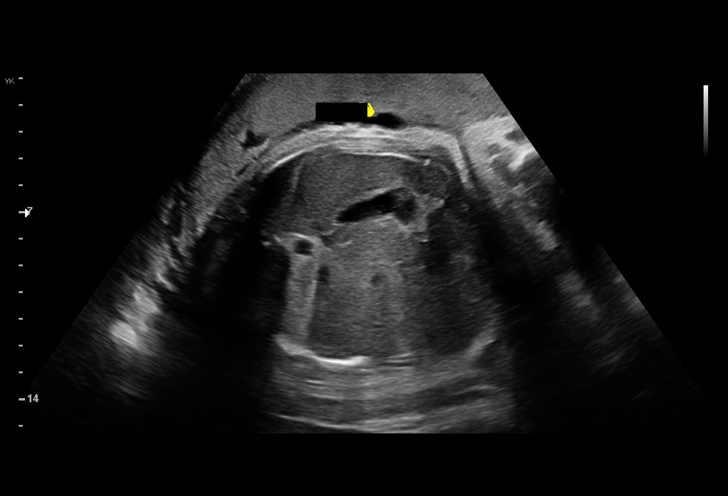
[im 21/81]
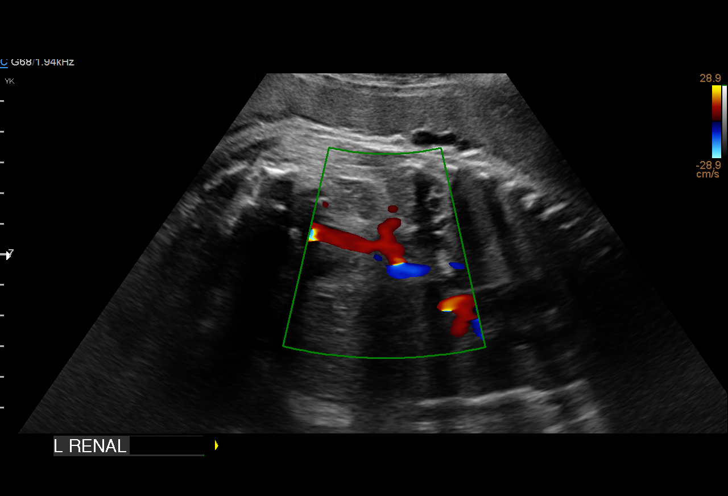
[im 27/81]
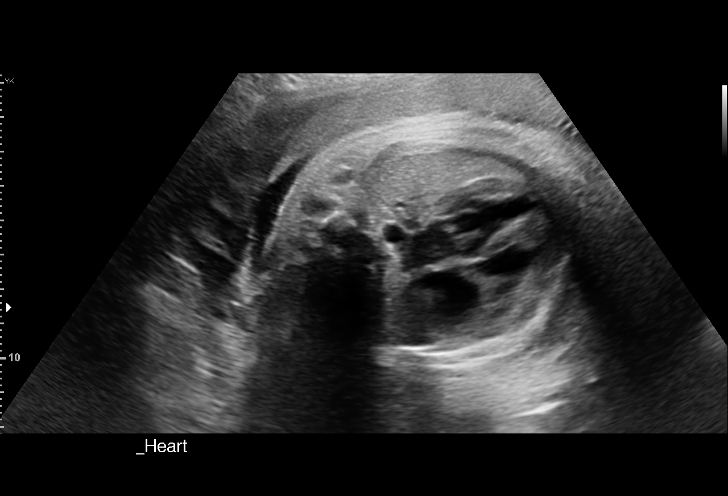
[im 33/81]
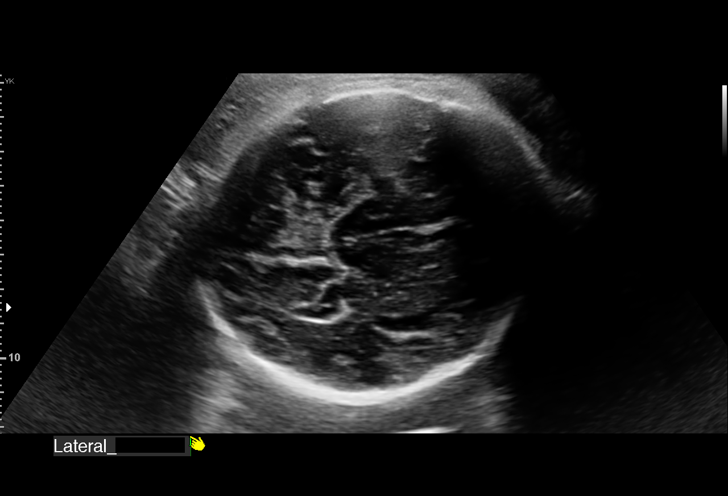
[im 39/81]
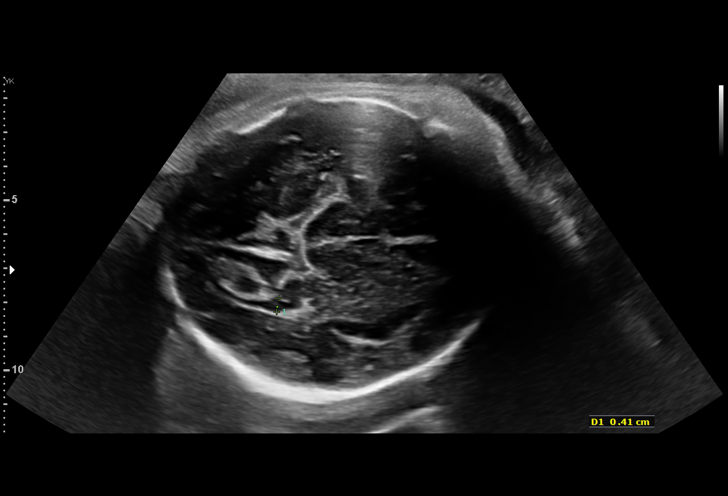
[im 45/81]
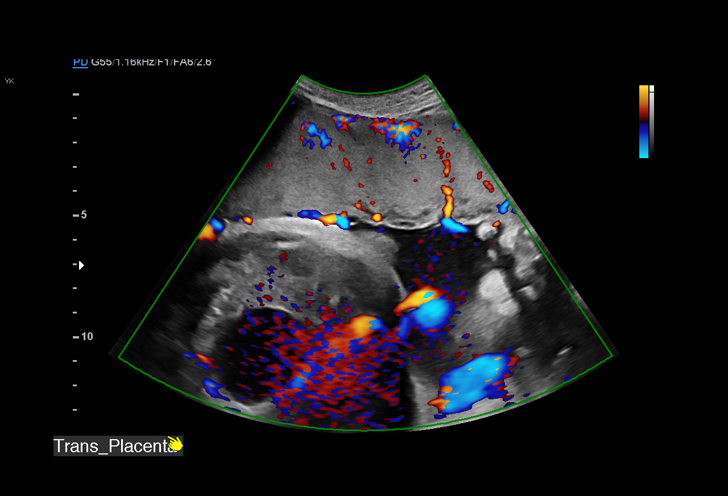
[im 51/81]
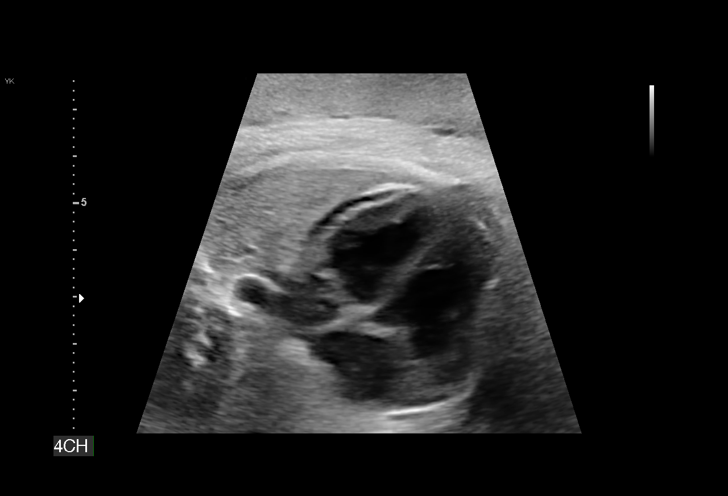
[im 57/81]
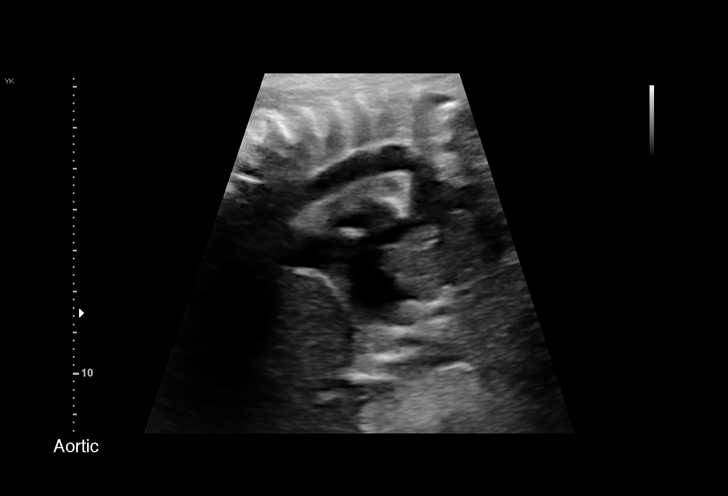
[im 63/81]
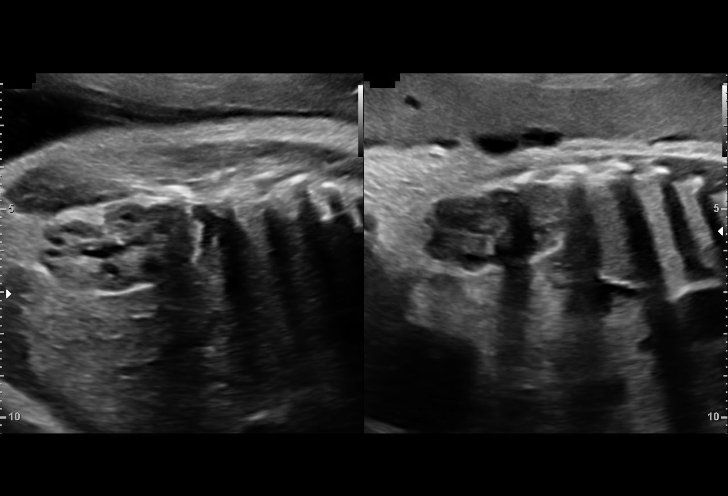
[im 69/81]
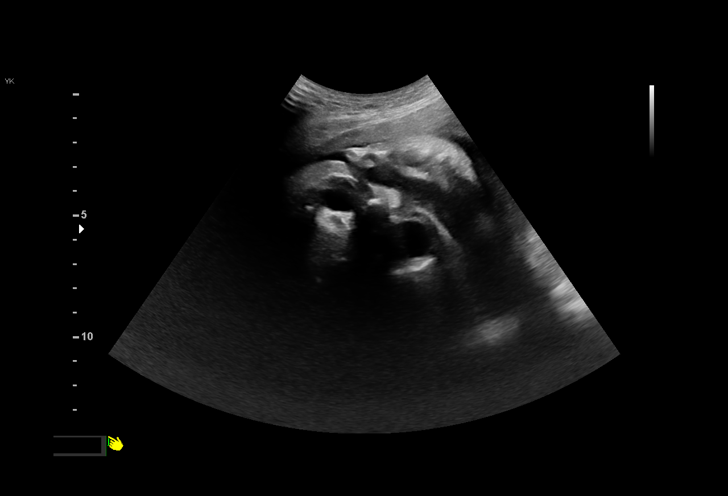
[im 75/81]
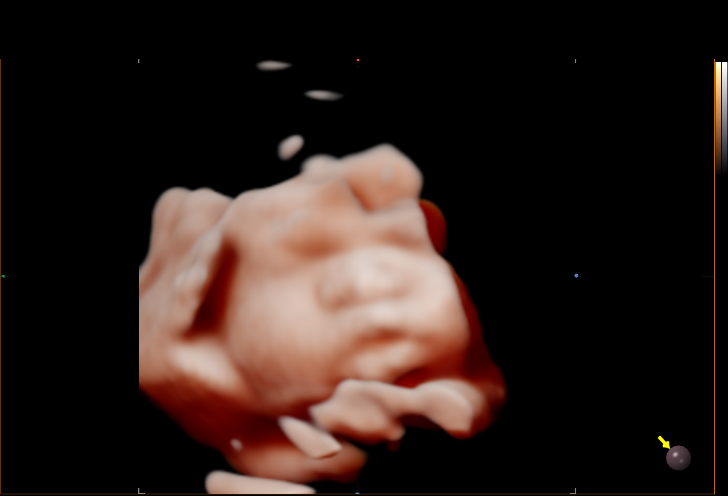
[im 81/81]
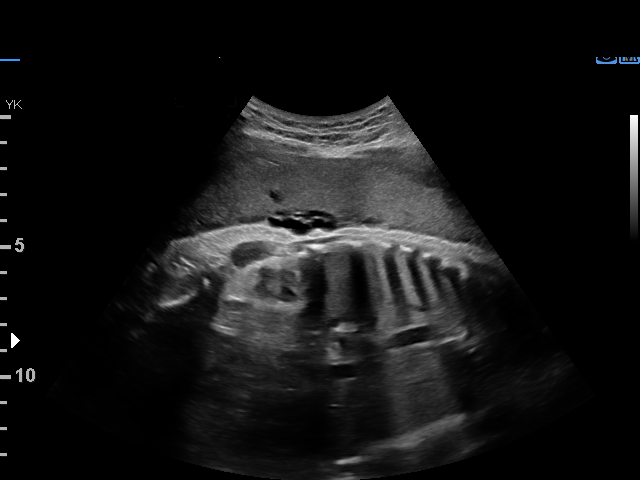

[14 of 28 positions shown; findings below may reference images not displayed]

Indications

34 weeks gestation of pregnancy
Advanced maternal age multigravida 35+,        [JA]
third trimester (T21 by NIPS)
Abnormal fetal ultrasound ( NT 3.6mm)          [JA]
Abnormal chromosomal and genetic finding       [JA]
on antenatal screening of mother (T21 by
NIPS)
Encounter for other antenatal screening        [JA]
follow-up
OB History

Gravidity:    4          SAB:   2
TOP:          1
Fetal Evaluation

Num Of Fetuses:     1
Fetal Heart         140
Rate(bpm):
Cardiac Activity:   Observed
Presentation:       Cephalic
Placenta:           Anterior
P. Cord Insertion:  Previously Visualized

Amniotic Fluid
AFI FV:      Subjectively within normal limits

AFI Sum(cm)     %Tile       Largest Pocket(cm)
21.64           82

RUQ(cm)       RLQ(cm)       LUQ(cm)        LLQ(cm)
6.72
Biometry

BPD:      86.9  mm     G. Age:  35w 0d         62  %    CI:        75.52   %    70 - 86
FL/HC:      20.7   %    20.1 -
HC:      317.1  mm     G. Age:  35w 4d         39  %    HC/AC:      0.92        0.93 -
AC:      345.6  mm     G. Age:  38w 3d       > 97  %    FL/BPD:     75.5   %    71 - 87
FL:       65.6  mm     G. Age:  33w 6d         20  %    FL/AC:      19.0   %    20 - 24
HUM:      58.7  mm     G. Age:  34w 0d         50  %

Est. FW:    [JA]  gm    6 lb 10 oz      89  %
Gestational Age

LMP:           34w 5d        Date:  [DATE]                 EDD:   [DATE]
U/S Today:     35w 5d                                        EDD:   [DATE]
Best:          34w 5d     Det. By:  LMP  ([DATE])          EDD:   [DATE]
Anatomy

Cranium:               Appears normal         Aortic Arch:            Appears normal
Cavum:                 Previously seen        Ductal Arch:            Previously seen
Ventricles:            Appears normal         Diaphragm:              Appears normal
Choroid Plexus:        Previously seen        Stomach:                Appears normal, left
sided
Cerebellum:            Previously seen        Abdomen:                Appears normal
Posterior Fossa:       Previously seen        Abdominal Wall:         Previously seen
Nuchal Fold:           Previously seen        Cord Vessels:           Previously seen
Face:                  Appears normal         Kidneys:                Appear normal
(orbits and profile)
Lips:                  Appears normal         Bladder:                Appears normal
Thoracic:              Appears normal         Spine:                  Previously seen
Heart:                 Possible  AV canal     Upper Extremities:      Previously seen
var-see ECHO
RVOT:                  Appears normal         Lower Extremities:      Previously seen
LVOT:                  Previously seen

Other:  Female gender. Heels and 5th digit previously visualized. Technically
difficult due to fetal position.
Cervix Uterus Adnexa

Cervix
Not visualized (advanced GA >[JA])

Uterus
No abnormality visualized.

Left Ovary
Not visualized.

Right Ovary
Not visualized.

Cul De Sac:   No free fluid seen.

Adnexa:       No abnormality visualized.
Impression

Fetal growth is appropriate for gestational age. Abdominal
circumference measures at greater than the 95th percentile.
Amniotic fluid is normal and good fetal activity is seen. Four-
chamber view is normal.

On cell-free fetal DNA screening, the risk for Down syndrome
was increased. Patient had opted not to have amniocentesis.
No cardiac anomaly (except the possibility of AV canal
variant) is seen.
Recommendations

Follow-up scans as clinically indicated.

## 2018-03-15 ENCOUNTER — Ambulatory Visit (HOSPITAL_COMMUNITY): Payer: BLUE CROSS/BLUE SHIELD

## 2018-03-15 DIAGNOSIS — Z3689 Encounter for other specified antenatal screening: Secondary | ICD-10-CM | POA: Diagnosis not present

## 2018-03-15 DIAGNOSIS — O09523 Supervision of elderly multigravida, third trimester: Secondary | ICD-10-CM | POA: Diagnosis not present

## 2018-03-15 DIAGNOSIS — O351XX0 Maternal care for (suspected) chromosomal abnormality in fetus, not applicable or unspecified: Secondary | ICD-10-CM | POA: Diagnosis not present

## 2018-03-15 DIAGNOSIS — O358XX0 Maternal care for other (suspected) fetal abnormality and damage, not applicable or unspecified: Secondary | ICD-10-CM | POA: Diagnosis not present

## 2018-03-19 DIAGNOSIS — Z3685 Encounter for antenatal screening for Streptococcus B: Secondary | ICD-10-CM | POA: Diagnosis not present

## 2018-03-19 DIAGNOSIS — Z3A35 35 weeks gestation of pregnancy: Secondary | ICD-10-CM | POA: Diagnosis not present

## 2018-03-19 DIAGNOSIS — O368330 Maternal care for abnormalities of the fetal heart rate or rhythm, third trimester, not applicable or unspecified: Secondary | ICD-10-CM | POA: Diagnosis not present

## 2018-03-26 ENCOUNTER — Encounter (HOSPITAL_COMMUNITY): Payer: Self-pay | Admitting: *Deleted

## 2018-03-26 ENCOUNTER — Inpatient Hospital Stay (HOSPITAL_COMMUNITY)
Admission: AD | Admit: 2018-03-26 | Discharge: 2018-03-29 | DRG: 807 | Disposition: A | Discharge status: Home / Self Care | Payer: BLUE CROSS/BLUE SHIELD | Attending: Obstetrics and Gynecology | Admitting: Obstetrics and Gynecology

## 2018-03-26 DIAGNOSIS — Z3A36 36 weeks gestation of pregnancy: Secondary | ICD-10-CM

## 2018-03-26 DIAGNOSIS — O351XX0 Maternal care for (suspected) chromosomal abnormality in fetus, not applicable or unspecified: Secondary | ICD-10-CM

## 2018-03-26 DIAGNOSIS — Z87891 Personal history of nicotine dependence: Secondary | ICD-10-CM | POA: Diagnosis not present

## 2018-03-26 DIAGNOSIS — Q211 Atrial septal defect: Secondary | ICD-10-CM | POA: Diagnosis not present

## 2018-03-26 DIAGNOSIS — Q909 Down syndrome, unspecified: Secondary | ICD-10-CM | POA: Diagnosis not present

## 2018-03-26 DIAGNOSIS — O4703 False labor before 37 completed weeks of gestation, third trimester: Secondary | ICD-10-CM | POA: Diagnosis not present

## 2018-03-26 DIAGNOSIS — Q250 Patent ductus arteriosus: Secondary | ICD-10-CM | POA: Diagnosis not present

## 2018-03-26 DIAGNOSIS — O4290 Premature rupture of membranes, unspecified as to length of time between rupture and onset of labor, unspecified weeks of gestation: Secondary | ICD-10-CM | POA: Diagnosis present

## 2018-03-26 DIAGNOSIS — O3510X0 Maternal care for (suspected) chromosomal abnormality in fetus, unspecified, not applicable or unspecified: Secondary | ICD-10-CM

## 2018-03-26 DIAGNOSIS — Q899 Congenital malformation, unspecified: Secondary | ICD-10-CM | POA: Diagnosis not present

## 2018-03-26 DIAGNOSIS — Z051 Observation and evaluation of newborn for suspected infectious condition ruled out: Secondary | ICD-10-CM | POA: Diagnosis not present

## 2018-03-26 DIAGNOSIS — Z23 Encounter for immunization: Secondary | ICD-10-CM | POA: Diagnosis not present

## 2018-03-26 DIAGNOSIS — Z4682 Encounter for fitting and adjustment of non-vascular catheter: Secondary | ICD-10-CM | POA: Diagnosis not present

## 2018-03-26 DIAGNOSIS — O99824 Streptococcus B carrier state complicating childbirth: Secondary | ICD-10-CM | POA: Diagnosis present

## 2018-03-26 DIAGNOSIS — O26893 Other specified pregnancy related conditions, third trimester: Secondary | ICD-10-CM | POA: Diagnosis not present

## 2018-03-26 DIAGNOSIS — Q249 Congenital malformation of heart, unspecified: Secondary | ICD-10-CM | POA: Diagnosis not present

## 2018-03-26 LAB — CBC
HCT: 35 % — ABNORMAL LOW (ref 36.0–46.0)
Hemoglobin: 12.1 g/dL (ref 12.0–15.0)
MCH: 28.5 pg (ref 26.0–34.0)
MCHC: 34.6 g/dL (ref 30.0–36.0)
MCV: 82.4 fL (ref 78.0–100.0)
PLATELETS: 298 10*3/uL (ref 150–400)
RBC: 4.25 MIL/uL (ref 3.87–5.11)
RDW: 13.9 % (ref 11.5–15.5)
WBC: 10.1 10*3/uL (ref 4.0–10.5)

## 2018-03-26 LAB — POCT FERN TEST: POCT Fern Test: NEGATIVE

## 2018-03-26 LAB — TYPE AND SCREEN
ABO/RH(D): A POS
ANTIBODY SCREEN: NEGATIVE

## 2018-03-26 LAB — GROUP B STREP BY PCR: Group B strep by PCR: NEGATIVE

## 2018-03-26 LAB — AMNISURE RUPTURE OF MEMBRANE (ROM) NOT AT ARMC: AMNISURE: POSITIVE

## 2018-03-26 MED ORDER — PENICILLIN G POT IN DEXTROSE 60000 UNIT/ML IV SOLN
3.0000 10*6.[IU] | INTRAVENOUS | Status: DC
  Administered 2018-03-26 – 2018-03-27 (×3): 3 10*6.[IU] via INTRAVENOUS
  Filled 2018-03-26 (×5): qty 50

## 2018-03-26 MED ORDER — OXYTOCIN BOLUS FROM INFUSION
500.0000 mL | Freq: Once | INTRAVENOUS | Status: AC
  Administered 2018-03-27: 500 mL via INTRAVENOUS

## 2018-03-26 MED ORDER — OXYTOCIN 40 UNITS IN LACTATED RINGERS INFUSION - SIMPLE MED
2.5000 [IU]/h | INTRAVENOUS | Status: DC
  Filled 2018-03-26: qty 1000

## 2018-03-26 MED ORDER — BETAMETHASONE SOD PHOS & ACET 6 (3-3) MG/ML IJ SUSP
12.0000 mg | INTRAMUSCULAR | Status: DC
  Administered 2018-03-26: 12 mg via INTRAMUSCULAR
  Filled 2018-03-26: qty 2

## 2018-03-26 MED ORDER — LACTATED RINGERS IV SOLN
500.0000 mL | INTRAVENOUS | Status: DC | PRN
  Administered 2018-03-27: 500 mL via INTRAVENOUS

## 2018-03-26 MED ORDER — SOD CITRATE-CITRIC ACID 500-334 MG/5ML PO SOLN: 30.0000 mL | ORAL | Status: DC | PRN

## 2018-03-26 MED ORDER — ACETAMINOPHEN 325 MG PO TABS: 650.0000 mg | ORAL_TABLET | ORAL | Status: DC | PRN

## 2018-03-26 MED ORDER — OXYCODONE-ACETAMINOPHEN 5-325 MG PO TABS: 1.0000 | ORAL_TABLET | ORAL | Status: DC | PRN

## 2018-03-26 MED ORDER — LIDOCAINE HCL (PF) 1 % IJ SOLN
30.0000 mL | INTRAMUSCULAR | Status: DC | PRN
  Filled 2018-03-26: qty 30

## 2018-03-26 MED ORDER — SODIUM CHLORIDE 0.9 % IV SOLN
5.0000 10*6.[IU] | Freq: Once | INTRAVENOUS | Status: AC
  Administered 2018-03-26: 5 10*6.[IU] via INTRAVENOUS
  Filled 2018-03-26: qty 5

## 2018-03-26 MED ORDER — OXYCODONE-ACETAMINOPHEN 5-325 MG PO TABS: 2.0000 | ORAL_TABLET | ORAL | Status: DC | PRN

## 2018-03-26 MED ORDER — LACTATED RINGERS IV SOLN
INTRAVENOUS | Status: DC
  Administered 2018-03-26: 16:00:00 via INTRAVENOUS

## 2018-03-26 MED ORDER — FLEET ENEMA 7-19 GM/118ML RE ENEM: 1.0000 | ENEMA | RECTAL | Status: DC | PRN

## 2018-03-26 MED ORDER — ONDANSETRON HCL 4 MG/2ML IJ SOLN
4.0000 mg | Freq: Four times a day (QID) | INTRAMUSCULAR | Status: DC | PRN
  Administered 2018-03-27: 4 mg via INTRAVENOUS
  Filled 2018-03-26: qty 2

## 2018-03-26 MED ORDER — FENTANYL CITRATE (PF) 100 MCG/2ML IJ SOLN
100.0000 ug | INTRAMUSCULAR | Status: DC | PRN
  Administered 2018-03-26: 100 ug via INTRAVENOUS
  Filled 2018-03-26: qty 2

## 2018-03-26 NOTE — Progress Notes (Signed)
Pt feeling painful ctx Q1-702min.  Declines epidural.  No vb.  + LOF  FHT cat 1 Toco Q2 Cvx 1.5/C/-1  A/P:  Continue exp mngt PCN  Epidural prn

## 2018-03-26 NOTE — MAU Provider Note (Signed)
History     CSN: [id number]  Arrival date and time: 03/26/18 1114   First Provider Initiated Contact with Patient 03/26/18 1251      Chief Complaint  Patient presents with  . Contractions  . Rupture of Membranes   HPI  Stephanie Watts is a 42 y.o. G4P0030 at [redacted]w[redacted]d who presents to MAU with questions ROM. Reports "big gush" of fluid this morning at 0845 with continued leaking of fluid since that time. Denies vaginal bleeding, decreased fetal movement, fever, falls, or recent illness.    OB History    Gravida  4   Para  0   Term  0   Preterm  0   AB  3   Living  0     SAB  2   TAB  1   Ectopic  0   Multiple  0   Live Births  0           Past Medical History:  Diagnosis Date  . [redacted] weeks gestation of pregnancy   . Allergic rhinitis   . Chest pain with low risk for cardiac etiology 05/06/2016  . Hx of herpes genitalis   . Migraine headache   . Occipital headache   . Palpitations 05/06/2016  . Suspected chromosome anomaly of fetus affecting management of mother in singleton pregnancy, antepartum 11/22/2017   T21 risk on Panorama, patient would like postnatal chromosome analysis for baby    Past Surgical History:  Procedure Laterality Date  . BUNIONECTOMY    . FACIAL RECONSTRUCTION SURGERY      Family History  Problem Relation Age of Onset  . Hypertension Mother   . High Cholesterol Father   . Hypertension Father   . Healthy Sister   . Healthy Brother   . Congestive Heart Failure Paternal Grandfather     Social History   Tobacco Use  . Smoking status: Former Smoker    Types: Cigarettes  . Smokeless tobacco: Never Used  . Tobacco comment: every 6 mths or so  Substance Use Topics  . Alcohol use: Not Currently    Alcohol/week: 0.6 - 1.2 oz    Types: 1 - 2 Glasses of wine per week  . Drug use: No    Allergies: No Known Allergies  Medications Prior to Admission  Medication Sig Dispense Refill Last Dose  . acetaminophen (TYLENOL) 325 MG  tablet Take 650 mg by mouth every 6 (six) hours as needed.   Taking  . Prenatal Multivit-Min-Fe-FA (PRENATAL VITAMINS PO) Take 1 tablet by mouth daily.   Taking  . valACYclovir (VALTREX) 500 MG tablet Take 500 mg by mouth as needed.  1 Not Taking    Review of Systems  Gastrointestinal: Negative for abdominal pain, nausea and vomiting.  Genitourinary: Positive for vaginal discharge. Negative for vaginal bleeding and vaginal pain.  Neurological: Negative for headaches.  All other systems reviewed and are negative.  Physical Exam   Blood pressure 132/81, pulse (!) 126, temperature 98.9 F (37.2 C), temperature source Oral, resp. rate 18, height 5\' 3"  (1.6 m), weight 152 lb (68.9 kg), last menstrual period 07/14/2017, SpO2 97 %, unknown if currently breastfeeding.  Physical Exam  Nursing note and vitals reviewed. Constitutional: She is oriented to person, place, and time. She appears well-developed and well-nourished.  Cardiovascular: Normal rate, regular rhythm, normal heart sounds and intact distal pulses.  Respiratory: Effort normal.  GI:  Gravid  Genitourinary:  Genitourinary Comments: Cat I EFM: baseline 130, moderate variability, positive  accels, no decels SVE: FT/thick/-3  Neurological: She is alert and oriented to person, place, and time. She has normal reflexes.  Psychiatric: She has a normal mood and affect. Her behavior is normal. Judgment and thought content normal.   No HSV lesions visualized  MAU Course  Procedures  MDM --POS pooling --POS Fern --POS Amnisure  Patient Vitals for the past 24 hrs:  BP Temp Temp src Pulse Resp SpO2 Height Weight  03/26/18 1132 132/81 98.9 F (37.2 C) Oral (!) 126 18 97 % 5\' 3"  (1.6 m) 152 lb (68.9 kg)    Assessment and Plan  --42 y.o. G4P0030 at 5541w3d  --SROM at 0845 today --Reactive NST --Hx HSV, no lesions --[facility]   RN to call Dr. Renaldo FiddlerAdkins for Admission Orders  Calvert CantorSamantha C Weinhold, CNM 03/26/2018, 1:33  PM

## 2018-03-26 NOTE — MAU Note (Signed)
?   Leaking fluid since 0845,

## 2018-03-26 NOTE — H&P (Signed)
Stephanie Watts is a 42 y.o.@ 36+3wks female presenting for ROM - clear fluid.  Now feeling occasional ctx.  No vb or lof.  Pregnancy complicated by elevated T21 risk on NIPT and possible AV canal variant on fetal echo.  Amniocentesis was not performed.  [facility] 2 wks postpartum.      OB History    Gravida  4   Para  0   Term  0   Preterm  0   AB  3   Living  0     SAB  2   TAB  1   Ectopic  0   Multiple  0   Live Births  0          Past Medical History:  Diagnosis Date  . [redacted] weeks gestation of pregnancy   . Allergic rhinitis   . Chest pain with low risk for cardiac etiology 05/06/2016  . Hx of herpes genitalis   . Migraine headache   . Occipital headache   . Palpitations 05/06/2016  . Suspected chromosome anomaly of fetus affecting management of mother in singleton pregnancy, antepartum 11/22/2017   T21 risk on Panorama, patient would like postnatal chromosome analysis for baby   Past Surgical History:  Procedure Laterality Date  . BUNIONECTOMY    . FACIAL RECONSTRUCTION SURGERY     Family History: family history includes Congestive Heart Failure in her paternal grandfather; Healthy in her brother and sister; High Cholesterol in her father; Hypertension in her father and mother. Social History:  reports that she has quit smoking. Her smoking use included cigarettes. She has never used smokeless tobacco. She reports that she drank about 0.6 - 1.2 oz of alcohol per week. She reports that she does not use drugs.     Maternal Diabetes: No Genetic Screening: Abnormal:  Results: Elevated risk of Trisomy 21 Maternal Ultrasounds/Referrals: Normal Fetal Ultrasounds or other Referrals:  Fetal echo Maternal Substance Abuse:  No Significant Maternal Medications:  Meds include: Other: valtrex Significant Maternal Lab Results:  None Other Comments:  None  ROS History Dilation: Fingertip Effacement (%): Thick Exam by:: weinhold  CNM Blood pressure 122/74, pulse (!) 101, temperature 98.2 F (36.8 C), resp. rate 18, height 5\' 3"  (1.6 m), weight 152 lb (68.9 kg), last menstrual period 07/14/2017, SpO2 100 %, unknown if currently breastfeeding. Exam Physical Exam  Gen - NAD Abd - gravid, NT  EFW 6# Ext - NT, no edema Cvx 0.5cm by MAU exam Prenatal labs: ABO, Rh: --/--/A POS (08/06 1537) Antibody: NEG (08/06 1537) Rubella: Immune (01/24 0000) RPR: Nonreactive (01/24 0000)  HBsAg: Negative (01/24 0000)  HIV: Non-reactive (01/24 0000)  GBS:   +  Assessment/Plan: Admit BMZ PCN Exp mngt - augment prn Epidural prn   Zelphia CairoGretchen Tangela Dolliver 03/26/2018, 4:49 PM

## 2018-03-27 ENCOUNTER — Inpatient Hospital Stay (HOSPITAL_COMMUNITY): Payer: BLUE CROSS/BLUE SHIELD | Admitting: Anesthesiology

## 2018-03-27 ENCOUNTER — Encounter (HOSPITAL_COMMUNITY): Payer: Self-pay | Admitting: Anesthesiology

## 2018-03-27 LAB — RPR: RPR Ser Ql: NONREACTIVE

## 2018-03-27 MED ORDER — ONDANSETRON HCL 4 MG PO TABS: 4.0000 mg | ORAL_TABLET | ORAL | Status: DC | PRN

## 2018-03-27 MED ORDER — PRENATAL MULTIVITAMIN CH
1.0000 | ORAL_TABLET | Freq: Every day | ORAL | Status: DC
  Administered 2018-03-27 – 2018-03-29 (×3): 1 via ORAL
  Filled 2018-03-27 (×3): qty 1

## 2018-03-27 MED ORDER — TERBUTALINE SULFATE 1 MG/ML IJ SOLN
0.2500 mg | Freq: Once | INTRAMUSCULAR | Status: DC | PRN
  Filled 2018-03-27: qty 1

## 2018-03-27 MED ORDER — MEDROXYPROGESTERONE ACETATE 150 MG/ML IM SUSP: 150.0000 mg | INTRAMUSCULAR | Status: DC | PRN

## 2018-03-27 MED ORDER — MEASLES, MUMPS & RUBELLA VAC ~~LOC~~ INJ
0.5000 mL | INJECTION | Freq: Once | SUBCUTANEOUS | Status: DC
  Filled 2018-03-27: qty 0.5

## 2018-03-27 MED ORDER — OXYCODONE-ACETAMINOPHEN 5-325 MG PO TABS: 2.0000 | ORAL_TABLET | ORAL | Status: DC | PRN

## 2018-03-27 MED ORDER — LIDOCAINE HCL (PF) 1 % IJ SOLN
INTRAMUSCULAR | Status: DC | PRN
  Administered 2018-03-27 (×2): 6 mL via EPIDURAL

## 2018-03-27 MED ORDER — DIBUCAINE 1 % RE OINT: 1.0000 "application " | TOPICAL_OINTMENT | RECTAL | Status: DC | PRN

## 2018-03-27 MED ORDER — SENNOSIDES-DOCUSATE SODIUM 8.6-50 MG PO TABS
2.0000 | ORAL_TABLET | ORAL | Status: DC
  Filled 2018-03-27 (×2): qty 2

## 2018-03-27 MED ORDER — OXYTOCIN 40 UNITS IN LACTATED RINGERS INFUSION - SIMPLE MED: 1.0000 m[IU]/min | INTRAVENOUS | Status: DC

## 2018-03-27 MED ORDER — PHENYLEPHRINE 40 MCG/ML (10ML) SYRINGE FOR IV PUSH (FOR BLOOD PRESSURE SUPPORT)
80.0000 ug | PREFILLED_SYRINGE | INTRAVENOUS | Status: DC | PRN
  Filled 2018-03-27: qty 10
  Filled 2018-03-27: qty 5

## 2018-03-27 MED ORDER — BENZOCAINE-MENTHOL 20-0.5 % EX AERO
1.0000 "application " | INHALATION_SPRAY | CUTANEOUS | Status: DC | PRN
  Administered 2018-03-28: 1 via TOPICAL
  Filled 2018-03-27 (×2): qty 56

## 2018-03-27 MED ORDER — EPHEDRINE 5 MG/ML INJ
10.0000 mg | INTRAVENOUS | Status: DC | PRN
  Filled 2018-03-27: qty 2

## 2018-03-27 MED ORDER — SIMETHICONE 80 MG PO CHEW: 80.0000 mg | CHEWABLE_TABLET | ORAL | Status: DC | PRN

## 2018-03-27 MED ORDER — WITCH HAZEL-GLYCERIN EX PADS: 1.0000 "application " | MEDICATED_PAD | CUTANEOUS | Status: DC | PRN

## 2018-03-27 MED ORDER — PHENYLEPHRINE 40 MCG/ML (10ML) SYRINGE FOR IV PUSH (FOR BLOOD PRESSURE SUPPORT)
80.0000 ug | PREFILLED_SYRINGE | INTRAVENOUS | Status: DC | PRN
  Filled 2018-03-27: qty 5

## 2018-03-27 MED ORDER — ACETAMINOPHEN 325 MG PO TABS
650.0000 mg | ORAL_TABLET | ORAL | Status: DC | PRN
  Administered 2018-03-27: 650 mg via ORAL
  Filled 2018-03-27: qty 2

## 2018-03-27 MED ORDER — ONDANSETRON HCL 4 MG/2ML IJ SOLN: 4.0000 mg | INTRAMUSCULAR | Status: DC | PRN

## 2018-03-27 MED ORDER — COCONUT OIL OIL
1.0000 "application " | TOPICAL_OIL | Status: DC | PRN
  Administered 2018-03-29: 1 via TOPICAL
  Filled 2018-03-27: qty 120

## 2018-03-27 MED ORDER — DIPHENHYDRAMINE HCL 25 MG PO CAPS: 25.0000 mg | ORAL_CAPSULE | Freq: Four times a day (QID) | ORAL | Status: DC | PRN

## 2018-03-27 MED ORDER — FENTANYL 2.5 MCG/ML BUPIVACAINE 1/10 % EPIDURAL INFUSION (WH - ANES)
14.0000 mL/h | INTRAMUSCULAR | Status: DC | PRN
  Administered 2018-03-27: 14 mL/h via EPIDURAL
  Filled 2018-03-27: qty 100

## 2018-03-27 MED ORDER — DIPHENHYDRAMINE HCL 50 MG/ML IJ SOLN
12.5000 mg | INTRAMUSCULAR | Status: DC | PRN
Start: 2018-03-27 — End: 2018-03-27

## 2018-03-27 MED ORDER — LACTATED RINGERS IV SOLN
500.0000 mL | Freq: Once | INTRAVENOUS | Status: AC
  Administered 2018-03-27: 500 mL via INTRAVENOUS

## 2018-03-27 MED ORDER — IBUPROFEN 600 MG PO TABS
600.0000 mg | ORAL_TABLET | Freq: Four times a day (QID) | ORAL | Status: DC
  Administered 2018-03-27 – 2018-03-29 (×8): 600 mg via ORAL
  Filled 2018-03-27 (×9): qty 1

## 2018-03-27 MED ORDER — OXYCODONE-ACETAMINOPHEN 5-325 MG PO TABS: 1.0000 | ORAL_TABLET | ORAL | Status: DC | PRN

## 2018-03-27 MED ORDER — TETANUS-DIPHTH-ACELL PERTUSSIS 5-2.5-18.5 LF-MCG/0.5 IM SUSP: 0.5000 mL | Freq: Once | INTRAMUSCULAR | Status: DC

## 2018-03-27 NOTE — Progress Notes (Signed)
Post Partum Day 0 Subjective: no complaints, up ad lib, voiding and tolerating PO  Objective: Blood pressure 107/69, pulse 86, temperature 98.8 F (37.1 C), temperature source Oral, resp. rate 18, height 5\' 3"  (1.6 m), weight 68.9 kg (152 lb), last menstrual period 07/14/2017, SpO2 98 %, unknown if currently breastfeeding.  Physical Exam:  General: alert, cooperative and appears stated age Lochia: appropriate Uterine Fundus: firm Incision: N/A DVT Evaluation: No evidence of DVT seen on physical exam. Negative Homan's sign. No cords or calf tenderness. No significant calf/ankle edema.  Recent Labs    03/26/18 1537  HGB 12.1  HCT 35.0*    Assessment/Plan: Breastfeeding Chromosomes were drawn for baby girl [name]   LOS: 1 day   Ranae Pilalise Jennifer Leger 03/27/2018, 3:53 PM

## 2018-03-27 NOTE — Lactation Note (Signed)
This note was copied from a baby's chart. Lactation Consultation Note  Patient Name: Stephanie Watts RUEAV'WToday's Date: 03/27/2018 Reason for consult: Follow-up assessment  [name] did relatively well with the green slow flow nipple, but there was a little bit of spillage noted from the sides of the mouth during the feeding. I spoke w/[name], SLP and she suggested that we try the NFant standard flow nipple.   Parents will call me for the next bottle feeding to assess how [name] does with the NFant standard nipple.  [name], [name] [facility] 03/27/2018, 12:34 PM

## 2018-03-27 NOTE — Anesthesia Preprocedure Evaluation (Signed)
Anesthesia Evaluation  Patient identified by MRN, date of birth, ID band Patient awake    Reviewed: Allergy & Precautions, H&P , NPO status , Patient's Chart, lab work & pertinent test results  Airway Mallampati: II  TM Distance: >3 FB Neck ROM: full    Dental no notable dental hx.    Pulmonary neg pulmonary ROS, former smoker,    Pulmonary exam normal breath sounds clear to auscultation       Cardiovascular negative cardio ROS   Rhythm:regular Rate:Normal     Neuro/Psych negative psych ROS   GI/Hepatic negative GI ROS, Neg liver ROS,   Endo/Other  negative endocrine ROS  Renal/GU negative Renal ROS  negative genitourinary   Musculoskeletal negative musculoskeletal ROS (+)   Abdominal Normal abdominal exam  (+)   Peds  Hematology negative hematology ROS (+)   Anesthesia Other Findings   Reproductive/Obstetrics (+) Pregnancy                             Anesthesia Physical Anesthesia Plan  ASA: II  Anesthesia Plan: Epidural   Post-op Pain Management:    Induction:   PONV Risk Score and Plan:   Airway Management Planned:   Additional Equipment:   Intra-op Plan:   Post-operative Plan:   Informed Consent: I have reviewed the patients History and Physical, chart, labs and discussed the procedure including the risks, benefits and alternatives for the proposed anesthesia with the patient or authorized representative who has indicated his/her understanding and acceptance.     Plan Discussed with:   Anesthesia Plan Comments:         Anesthesia Quick Evaluation

## 2018-03-27 NOTE — Lactation Note (Addendum)
This note was copied from a baby's chart. Lactation Consultation Note  Patient Name: Stephanie Weston AnnaFelicia Figiel RUEAV'WToday's Date: 03/27/2018 Reason for consult: Follow-up assessment  Nfant standard nipple was attempted, but infant began swallowing quickly and there was spillage out of the sides of her mouth. The bottle nipple was switched to the Nfant slow-flow nipple. Initially, infant did well with the slow-flow nipple (after infant got used to it), but we stopped so that infant could receive glucose gel.  Once glucose gel was given, bottle feeding with the slow-flow nipple was resumed. Infant would not suck on bottle nipple. We returned to the Nfant standard flow using side-lying position & having an eye for assertive pacing. However, infant would not suck. We attempted the Enfamil slow-flow nipple, but without success.  Finger-feeding was attempted, but infant would not suckle well enough on finger. Dr Erik Obeyeitnauer was made aware of difficult feeding/intake after glucose gel.   Infant's tongue does not seem hypotonic.  Blood sugar to be drawn soon.   Stephanie Watts, Stephanie Watts 03/27/2018, 3:31 PM  1539: Lab in room to draw infant's glucose.

## 2018-03-27 NOTE — Anesthesia Postprocedure Evaluation (Signed)
Anesthesia Post Note  Patient: Stephanie Watts  Procedure(s) Performed: AN AD HOC LABOR EPIDURAL     Patient location during evaluation: Mother Baby Anesthesia Type: Epidural Level of consciousness: awake and alert and oriented Pain management: satisfactory to patient Vital Signs Assessment: post-procedure vital signs reviewed and stable Respiratory status: spontaneous breathing and nonlabored ventilation Cardiovascular status: stable Postop Assessment: no headache, no backache, no signs of nausea or vomiting, adequate PO intake and patient able to bend at knees (patient up walking) Anesthetic complications: no    Last Vitals:  Vitals:   03/27/18 1040 03/27/18 1440  BP: 111/71 107/69  Pulse: 95 86  Resp: 16 18  Temp: 36.8 C 37.1 C  SpO2:      Last Pain:  Vitals:   03/27/18 1459  TempSrc:   PainSc: 5    Pain Goal:                 Madison HickmanGREGORY,Stephanie Watts

## 2018-03-27 NOTE — Progress Notes (Signed)
Pt now comfortable w/ epidural  FHT reassuring Toco Q3-5 Cvx 1.5cm per last RN check  A/P:  Will augment w/ pitocin

## 2018-03-27 NOTE — Progress Notes (Signed)
SVD of vigorous female infant  Placenta delivered spontaneous w/ 3VC.   2nd degree lac repaired w/ 3-0 vicryl rapide.  Fundus firm.  EBL 100cc .   

## 2018-03-27 NOTE — Anesthesia Procedure Notes (Signed)
Epidural Patient location during procedure: OB Start time: 03/27/2018 1:08 AM End time: 03/27/2018 1:10 AM  Staffing Anesthesiologist: Leilani AbleHatchett, Lacharles Altschuler, MD Performed: anesthesiologist   Preanesthetic Checklist Completed: patient identified, site marked, surgical consent, pre-op evaluation, timeout performed, IV checked, risks and benefits discussed and monitors and equipment checked  Epidural Patient position: sitting Prep: site prepped and draped and DuraPrep Patient monitoring: continuous pulse ox and blood pressure Approach: midline Location: L3-L4 Injection technique: LOR air  Needle:  Needle type: Tuohy  Needle gauge: 17 G Needle length: 9 cm and 9 Needle insertion depth: 5 cm cm Catheter type: closed end flexible Catheter size: 19 Gauge Catheter at skin depth: 10 cm Test dose: negative and Other  Assessment Sensory level: T9 Events: blood not aspirated, injection not painful, no injection resistance, negative IV test and no paresthesia  Additional Notes Reason for block:procedure for pain

## 2018-03-27 NOTE — Lactation Note (Signed)
This note was copied from a baby's chart. Lactation Consultation Note  Patient Name: Stephanie Watts ZOXWR'UToday's Date: 03/27/2018 Reason for consult: Primapara;Late-preterm 34-36.6wks;Other (Comment)(Likely trisomy 21)  Initial visit at 3 hours of life. I entered room with Mom pumping. Mom was shown how to increase suction. Lafonda Mossesonna Wear, RN, had done hand expression on her R breast, but got nothing. Mom finished pumping but had no results. Infant had gone to the breast twice previously, but without successful latching. Infant is 36 weeks, may have Trisomy 21, & glucose was 32.  Infant given Neosure 22 using Slow-Flow green nipple. Mom & visitors shown how to do paced bottle-feeding.  Volume parameters based on day of life provided, but I emphasized feeding infant until satiated. Visitor & Mom were shown how to use DEBP & how to disassemble, clean, & reassemble parts.  [name], Chester Sibert [facility] 03/27/2018, 11:50 AM

## 2018-03-27 NOTE — Lactation Note (Addendum)
This note was copied from a baby's chart. Lactation Consultation Note  Patient Name: Stephanie Watts ZOXWR'UToday's Date: 03/27/2018   P1 mother whose infant is now 212 hours old.  This is a LPTI at 36+4 weeks who weighs 6+14.9 oz.  Received report from Glenetta HewKim [name], [facility] about [facility].  Sat in on report with outgoing and incoming RN's at shift change.  I am concerned, as was the previous LC, regarding this baby's ability to continue to feed effectively.  She had an initial blood sugar of 32 mg/dl at [id number] and another blood sugar of 26 mg/dl at [id number].  She was given one dose of glucose gel.  Multiple nipples have been tried with little success including the regular slow flow, Nfant standard and Nfant slow flow and baby does not seem to have any desire to suck effectively on any of these options.  A curved tip syringe was also implemented without success.  She has been using Similac 22 calorie as her formula.  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  At this time I would suggest baby have a chance to rest and be OG fed per policy in the nursery x 2.  Spoke with nursery charge RN to be sure this was possible and also to Marylene LandAngela, shift supervisor regarding my concern for this baby.  Rosey Batheresa, RN (night shift RN) will be calling the pediatrician to discuss this baby's current feeding situation.  She and I have already collaborated on a possible plan and will work effectively together to address this feeding issue.  I also suggested RN to review in detail the LPTI policy with the parents which she is going to do.  At this time the baby is not even taking her minimum requirement for supplementation.  I will keep in close contact for the remainder of my shift to assist RN in helping this baby and family.     Lunah Losasso R Treston Coker 03/27/2018, 7:46 PM

## 2018-03-28 ENCOUNTER — Other Ambulatory Visit: Payer: Self-pay

## 2018-03-28 LAB — CBC
HCT: 33.6 % — ABNORMAL LOW (ref 36.0–46.0)
Hemoglobin: 11.5 g/dL — ABNORMAL LOW (ref 12.0–15.0)
MCH: 28.4 pg (ref 26.0–34.0)
MCHC: 34.2 g/dL (ref 30.0–36.0)
MCV: 83 fL (ref 78.0–100.0)
PLATELETS: 307 10*3/uL (ref 150–400)
RBC: 4.05 MIL/uL (ref 3.87–5.11)
RDW: 14.3 % (ref 11.5–15.5)
WBC: 13.1 10*3/uL — AB (ref 4.0–10.5)

## 2018-03-28 NOTE — Progress Notes (Signed)
Post Partum Day 1 Subjective: no complaints, up ad lib, voiding, tolerating PO and + flatus Baby in NICU Objective: Blood pressure 102/66, pulse 68, temperature 97.8 F (36.6 C), temperature source Oral, resp. rate 18, height 5\' 3"  (1.6 m), weight 68.9 kg, last menstrual period 07/14/2017, SpO2 98 %, unknown if currently breastfeeding.  Physical Exam:  General: alert, cooperative and no distress Lochia: appropriate Uterine Fundus: firm Incision: healing well DVT Evaluation: No evidence of DVT seen on physical exam.  Recent Labs    03/26/18 1537 03/28/18 0630  HGB 12.1 11.5*  HCT 35.0* 33.6*    Assessment/Plan: Plan for discharge tomorrow   LOS: 2 days   Roselle LocusJames E Acire Tang II 03/28/2018, 7:33 AM

## 2018-03-29 MED ORDER — IBUPROFEN 600 MG PO TABS: 600.0000 mg | ORAL_TABLET | Freq: Four times a day (QID) | ORAL | 0 refills | Status: AC

## 2018-03-29 NOTE — Lactation Note (Signed)
This note was copied from a baby's chart. Lactation Consultation Note  Patient Name: Girl Weston AnnaFelicia Gaughran WUJWJ'XToday's Date: 03/29/2018   [facility] Up Prior to [facility]charge:  Mother has returned from NICU.  She stated that her baby is doing better and the staff have recently removed her oxygen.    Mother has been pumping and will continue to pump and transport EBM to NICU.  She knows to transport in a cooler with ice.  Encouraged mother to hand express colostrum and rub into nipples and areolas after pumping.  Her RN is also going to get coconut oil for her.    Mother will be discharged shortly.  She has a DEBP for home use.  Encouraged her to take her pump parts to NICU and store them in baby's bed so she can pump when she visits baby knowing this may help increase her milk supply.  Continue to do STS, breast massage and hand expression.  Mother has no questions/concerns but will call as needed.    Feeding Feeding Type: Formula Nipple Type: Nfant Extra Slow Flow (gold) Length of feed: 15 min      Markela Wee R Rosealyn Little 03/29/2018, 3:12 PM

## 2018-03-29 NOTE — Discharge Summary (Signed)
Obstetric Discharge Summary Reason for Admission: onset of labor Prenatal Procedures: none Intrapartum Procedures: spontaneous vaginal delivery Postpartum Procedures: none Complications-Operative and Postpartum: none Hemoglobin  Date Value Ref Range Status  03/28/2018 11.5 (L) 12.0 - 15.0 g/dL Final   HCT  Date Value Ref Range Status  03/28/2018 33.6 (L) 36.0 - 46.0 % Final    Physical Exam:  General: alert Lochia: appropriate Uterine Fundus: firm Incision: healing well DVT Evaluation: No evidence of DVT seen on physical exam.  Discharge Diagnoses: Term Pregnancy-delivered, infant in  Discharge Information: Date: 03/29/2018 Activity: pelvic rest Diet: routine Medications: PNV and Ibuprofen Condition: stable Instructions: refer to practice specific booklet Discharge to: home Follow-up Information    [facility] [place] Follow up in 6 week(s).   Contact information: [address] [place] [id number] [phone]           Newborn Data: Live born female  Birth Weight: 6 lb 14.9 oz (3145 g) APGAR: 6, 7  Newborn Delivery   Birth date/time:  03/27/2018 07:21:00 Delivery type:  Vaginal, Spontaneous     Home with stable, IN NICU.  Stephanie Stclair Milana ObeyM Stephanie Watts 03/29/2018, 1:50 PM

## 2018-03-29 NOTE — Lactation Note (Signed)
This note was copied from a baby's chart. Lactation Consultation Note  Patient Name: Stephanie Watts ZOXWR'UToday's Date: 03/29/2018   [facility] to [facility]:  Attempted to visit with mother prior to discharge.  Mother is not in her room at this time and LC will return later.                   Dannielle Baskins R Jerid Catherman 03/29/2018, 11:51 AM

## 2018-03-29 NOTE — Progress Notes (Signed)
Post Partum Day 2 Subjective: no complaints  Objective: Blood pressure 103/81, pulse 86, temperature 98.3 F (36.8 C), temperature source Oral, resp. rate 18, height 5\' 3"  (1.6 m), weight 68.9 kg, last menstrual period 07/14/2017, SpO2 98 %, unknown if currently breastfeeding.  Physical Exam:  General: alert Lochia: appropriate Uterine Fundus: firm Incision: healing well DVT Evaluation: No evidence of DVT seen on physical exam.  Recent Labs    03/26/18 1537 03/28/18 0630  HGB 12.1 11.5*  HCT 35.0* 33.6*    Assessment/Plan: Plan for discharge tomorrow Baby feeding in NICU, had ECHO  LOS: 3 days   Meriel PicaRichard M Tarez Bowns 03/29/2018, 9:21 AM

## 2018-05-06 DIAGNOSIS — Z1389 Encounter for screening for other disorder: Secondary | ICD-10-CM | POA: Diagnosis not present

## 2018-05-22 DIAGNOSIS — Z23 Encounter for immunization: Secondary | ICD-10-CM | POA: Diagnosis not present

## 2018-09-07 DIAGNOSIS — A084 Viral intestinal infection, unspecified: Secondary | ICD-10-CM | POA: Diagnosis not present

## 2018-09-07 DIAGNOSIS — J069 Acute upper respiratory infection, unspecified: Secondary | ICD-10-CM | POA: Diagnosis not present

## 2019-04-21 DIAGNOSIS — Z23 Encounter for immunization: Secondary | ICD-10-CM | POA: Diagnosis not present

## 2019-04-21 DIAGNOSIS — Z13220 Encounter for screening for lipoid disorders: Secondary | ICD-10-CM | POA: Diagnosis not present

## 2019-04-21 DIAGNOSIS — M545 Low back pain: Secondary | ICD-10-CM | POA: Diagnosis not present

## 2019-04-28 DIAGNOSIS — Z20828 Contact with and (suspected) exposure to other viral communicable diseases: Secondary | ICD-10-CM | POA: Diagnosis not present

## 2019-05-12 DIAGNOSIS — Z1321 Encounter for screening for nutritional disorder: Secondary | ICD-10-CM | POA: Diagnosis not present

## 2019-05-12 DIAGNOSIS — Z6826 Body mass index (BMI) 26.0-26.9, adult: Secondary | ICD-10-CM | POA: Diagnosis not present

## 2019-05-12 DIAGNOSIS — Z131 Encounter for screening for diabetes mellitus: Secondary | ICD-10-CM | POA: Diagnosis not present

## 2019-05-12 DIAGNOSIS — Z800 Family history of malignant neoplasm of digestive organs: Secondary | ICD-10-CM | POA: Diagnosis not present

## 2019-05-12 DIAGNOSIS — Z01419 Encounter for gynecological examination (general) (routine) without abnormal findings: Secondary | ICD-10-CM | POA: Diagnosis not present

## 2019-05-12 DIAGNOSIS — Z8042 Family history of malignant neoplasm of prostate: Secondary | ICD-10-CM | POA: Diagnosis not present

## 2019-05-12 DIAGNOSIS — Z803 Family history of malignant neoplasm of breast: Secondary | ICD-10-CM | POA: Diagnosis not present

## 2019-05-12 DIAGNOSIS — Z13228 Encounter for screening for other metabolic disorders: Secondary | ICD-10-CM | POA: Diagnosis not present

## 2019-05-12 DIAGNOSIS — Z1231 Encounter for screening mammogram for malignant neoplasm of breast: Secondary | ICD-10-CM | POA: Diagnosis not present

## 2019-05-12 DIAGNOSIS — Z13220 Encounter for screening for lipoid disorders: Secondary | ICD-10-CM | POA: Diagnosis not present

## 2019-05-19 DIAGNOSIS — Z800 Family history of malignant neoplasm of digestive organs: Secondary | ICD-10-CM | POA: Diagnosis not present

## 2019-06-23 DIAGNOSIS — Z1159 Encounter for screening for other viral diseases: Secondary | ICD-10-CM | POA: Diagnosis not present

## 2019-06-26 DIAGNOSIS — Z800 Family history of malignant neoplasm of digestive organs: Secondary | ICD-10-CM | POA: Diagnosis not present

## 2019-06-26 DIAGNOSIS — Z1211 Encounter for screening for malignant neoplasm of colon: Secondary | ICD-10-CM | POA: Diagnosis not present

## 2019-07-14 DIAGNOSIS — Z800 Family history of malignant neoplasm of digestive organs: Secondary | ICD-10-CM | POA: Diagnosis not present

## 2019-07-14 DIAGNOSIS — Z9189 Other specified personal risk factors, not elsewhere classified: Secondary | ICD-10-CM | POA: Diagnosis not present

## 2019-07-14 DIAGNOSIS — Z309 Encounter for contraceptive management, unspecified: Secondary | ICD-10-CM | POA: Diagnosis not present

## 2019-07-15 ENCOUNTER — Other Ambulatory Visit: Payer: Self-pay | Admitting: Obstetrics and Gynecology

## 2019-07-15 DIAGNOSIS — Z9189 Other specified personal risk factors, not elsewhere classified: Secondary | ICD-10-CM

## 2019-10-10 DIAGNOSIS — T148XXA Other injury of unspecified body region, initial encounter: Secondary | ICD-10-CM | POA: Diagnosis not present

## 2019-10-19 DIAGNOSIS — Z20822 Contact with and (suspected) exposure to covid-19: Secondary | ICD-10-CM | POA: Diagnosis not present

## 2019-10-21 DIAGNOSIS — Z0000 Encounter for general adult medical examination without abnormal findings: Secondary | ICD-10-CM | POA: Diagnosis not present

## 2019-12-10 DIAGNOSIS — Z1231 Encounter for screening mammogram for malignant neoplasm of breast: Secondary | ICD-10-CM | POA: Diagnosis not present

## 2020-01-15 DIAGNOSIS — Z309 Encounter for contraceptive management, unspecified: Secondary | ICD-10-CM | POA: Diagnosis not present

## 2020-01-15 DIAGNOSIS — N911 Secondary amenorrhea: Secondary | ICD-10-CM | POA: Diagnosis not present

## 2020-03-18 DIAGNOSIS — Z20822 Contact with and (suspected) exposure to covid-19: Secondary | ICD-10-CM | POA: Diagnosis not present

## 2020-04-05 DIAGNOSIS — S0990XA Unspecified injury of head, initial encounter: Secondary | ICD-10-CM | POA: Diagnosis not present

## 2020-04-06 DIAGNOSIS — Z20828 Contact with and (suspected) exposure to other viral communicable diseases: Secondary | ICD-10-CM | POA: Diagnosis not present

## 2020-06-23 DIAGNOSIS — Z01419 Encounter for gynecological examination (general) (routine) without abnormal findings: Secondary | ICD-10-CM | POA: Diagnosis not present

## 2020-06-23 DIAGNOSIS — Z6824 Body mass index (BMI) 24.0-24.9, adult: Secondary | ICD-10-CM | POA: Diagnosis not present

## 2020-06-24 ENCOUNTER — Other Ambulatory Visit: Payer: Self-pay | Admitting: Obstetrics and Gynecology

## 2020-06-24 DIAGNOSIS — Z9189 Other specified personal risk factors, not elsewhere classified: Secondary | ICD-10-CM

## 2020-07-12 ENCOUNTER — Other Ambulatory Visit: Payer: Self-pay

## 2020-07-12 ENCOUNTER — Ambulatory Visit
Admission: RE | Admit: 2020-07-12 | Discharge: 2020-07-12 | Disposition: A | Discharge status: Home / Self Care | Payer: BC Managed Care – PPO | Source: Ambulatory Visit | Attending: Obstetrics and Gynecology | Admitting: Obstetrics and Gynecology

## 2020-07-12 DIAGNOSIS — Z9189 Other specified personal risk factors, not elsewhere classified: Secondary | ICD-10-CM

## 2020-07-12 DIAGNOSIS — N6312 Unspecified lump in the right breast, upper inner quadrant: Secondary | ICD-10-CM | POA: Diagnosis not present

## 2020-07-12 IMAGING — MR MR BREAST BILAT WO/W CM
9 of 13 series · 33 of 48 positions shown · IV contrast (gadavist)
Comparison: Prior outside mammograms and ultrasounds

CLINICAL DATA: 44-year-old female for screening MRI with high
lifetime risk for developing breast cancer.

LABS:  None performed today
EXAM:
BILATERAL BREAST MRI WITH AND WITHOUT CONTRAST
TECHNIQUE: Multiplanar, multisequence MR images of both breasts were obtained
prior to and following the intravenous administration of 6 ml of
Gadavist

[Series 2: t2_tirm_tra ipat (a-p) · axial · 3.0mm · 0.70mm/px · 1 of 55 slices shown]
[im 1/55]
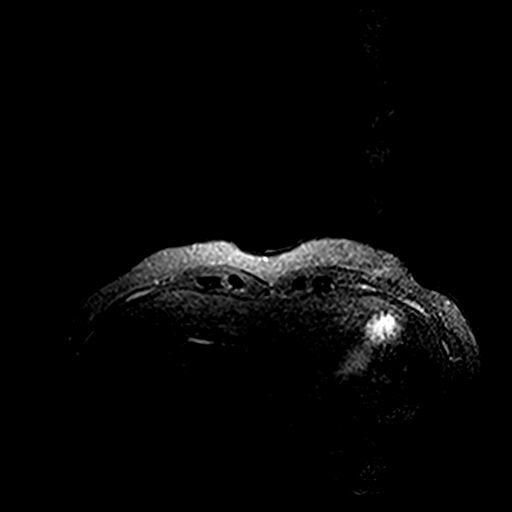

[Series 3: fl3d pre-cm no · axial · non-contrast · 1.2mm · 0.94mm/px · z∈[-64,+108]mm · 4 of 144 slices shown]
[im 1/144]
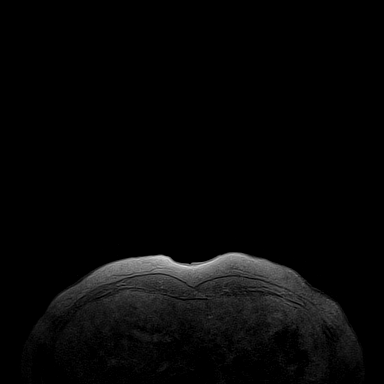
[im 48/144]
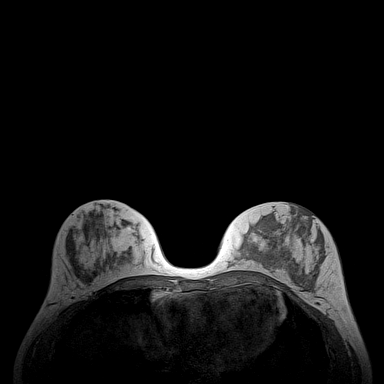
[im 96/144]
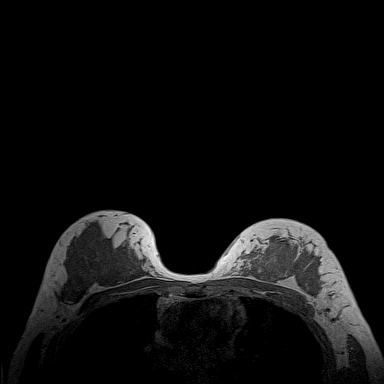
[im 144/144]
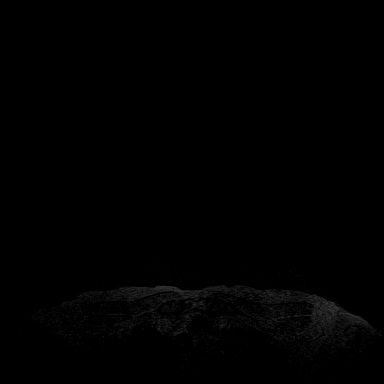

[Series 4: fl3d pre-cm · axial · non-contrast · 1.2mm · 0.94mm/px · z∈[-64,+108]mm · 5 of 144 slices shown (1 of 2)]
[im 1/144]
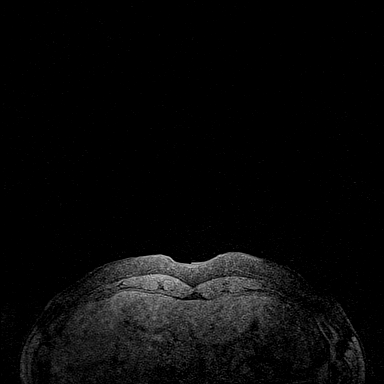
[im 36/144]
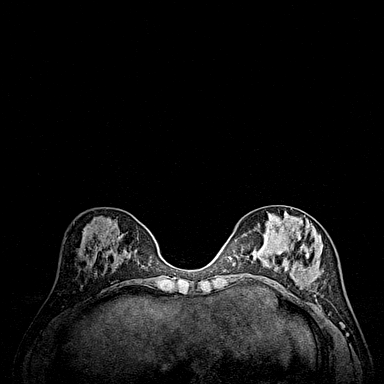
[im 72/144]
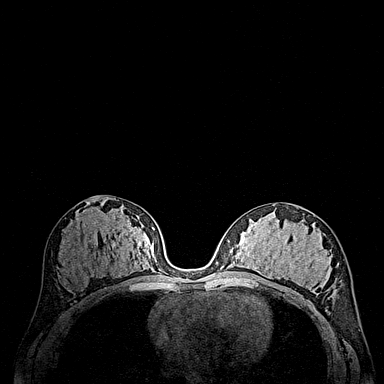
[im 108/144]
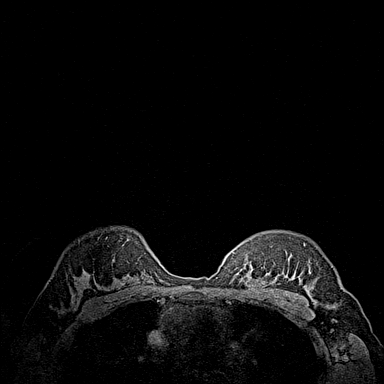
[im 144/144]
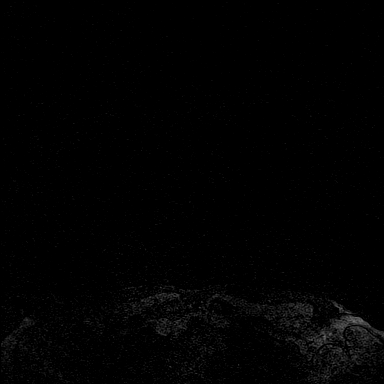

[Series 5: fl3d pre-cm · axial · non-contrast · 1.2mm · 0.94mm/px · z∈[-64,+108]mm · 5 of 144 slices shown (2 of 2)]
[im 1/144]
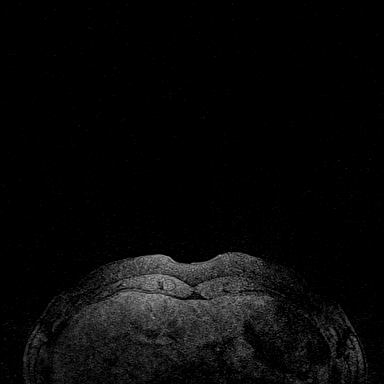
[im 36/144]
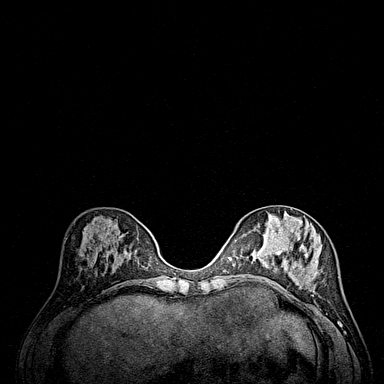
[im 72/144]
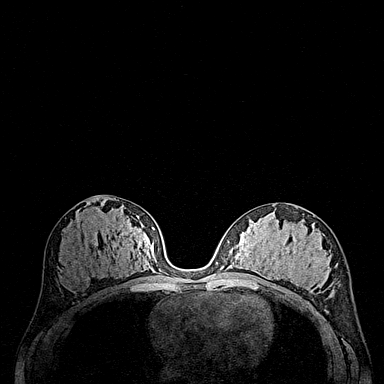
[im 108/144]
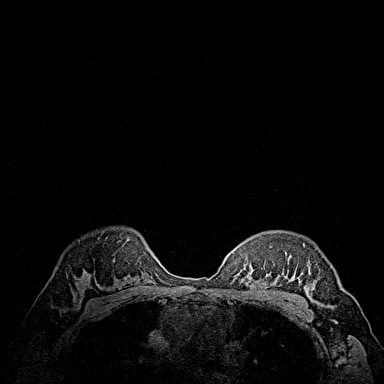
[im 144/144]
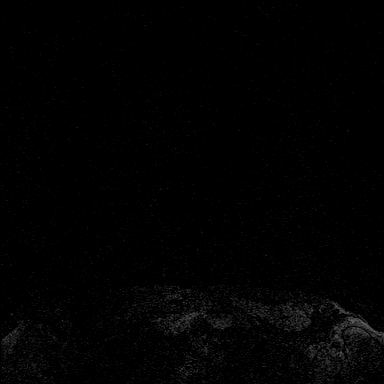

[Series 6: fl3d post-cm 20 · axial · 1.2mm · 0.94mm/px · z∈[-64,+108]mm · 5 of 144 slices shown (1 of 3)]
[im 1/144]
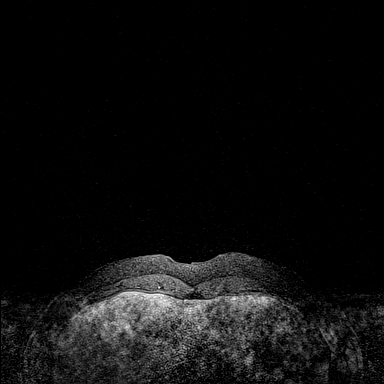
[im 36/144]
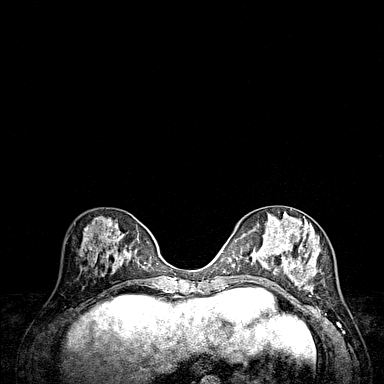
[im 72/144]
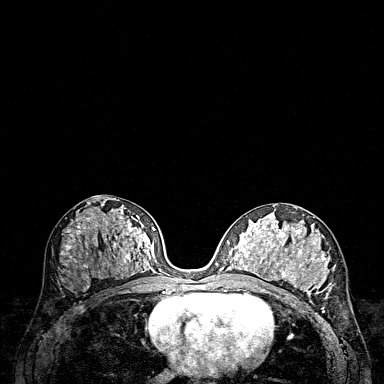
[im 108/144]
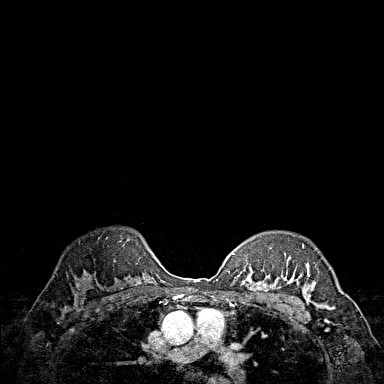
[im 144/144]
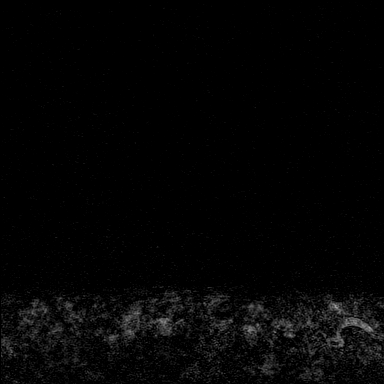

[Series 7: fl3d post-cm 20 · axial · 1.2mm · 0.94mm/px · z∈[-64,+108]mm · 5 of 144 slices shown (2 of 3)]
[im 1/144]
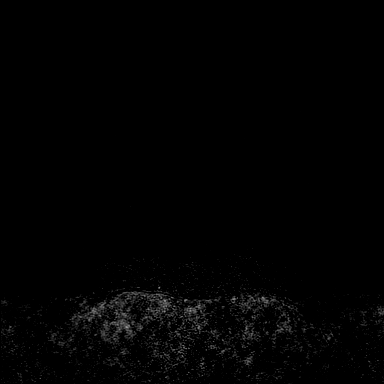
[im 36/144]
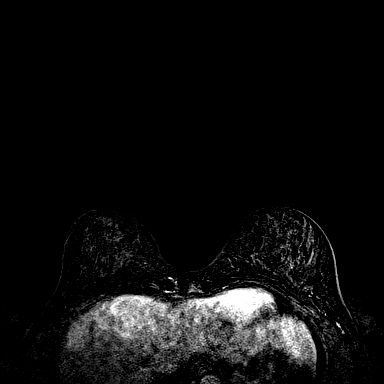
[im 72/144]
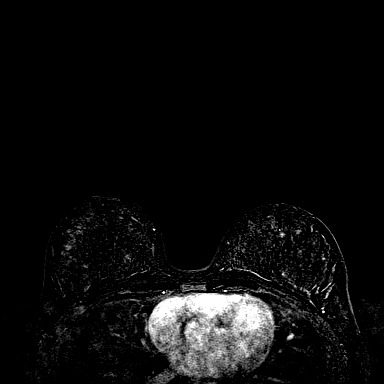
[im 108/144]
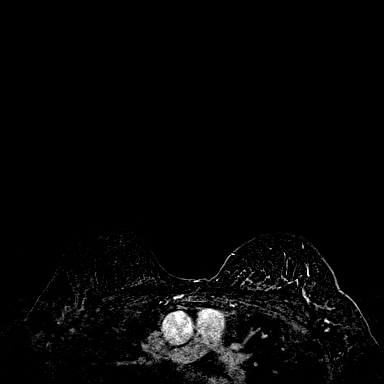
[im 144/144]
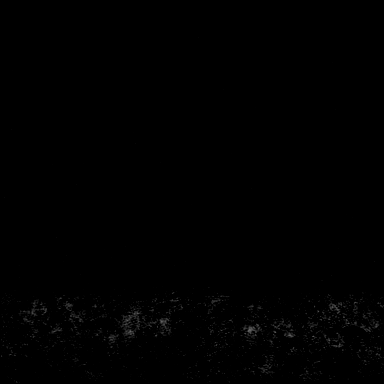

[Series 8: fl3d post-cm 20 · axial · 172.8mm · 0.94mm/px · 1 of 1 slices shown (3 of 3)]
[im 1/1]
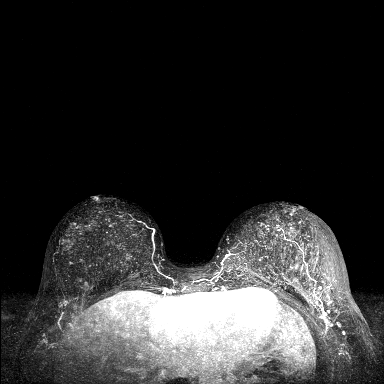

[Series 9: fl3d post-cm 3min · axial · 1.2mm · 0.94mm/px · z∈[-64,+108]mm · 5 of 144 slices shown]
[im 1/144]
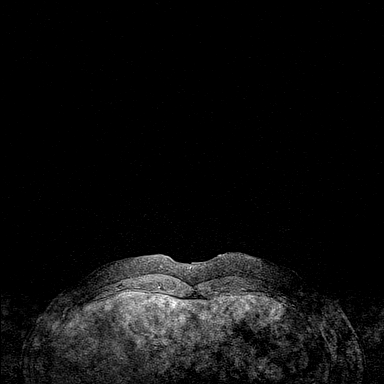
[im 36/144]
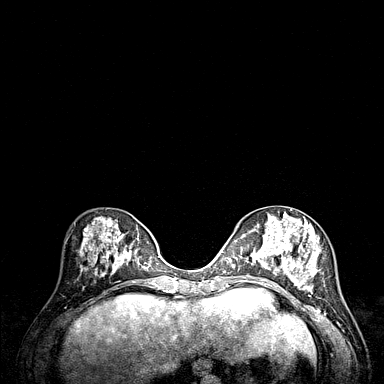
[im 72/144]
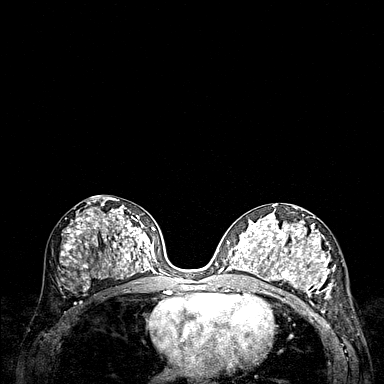
[im 108/144]
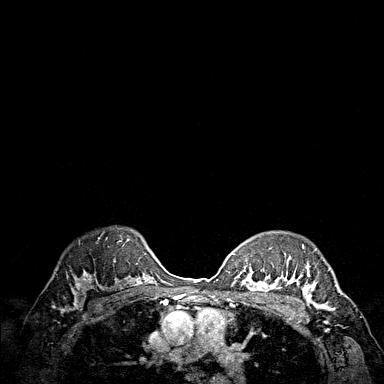
[im 144/144]
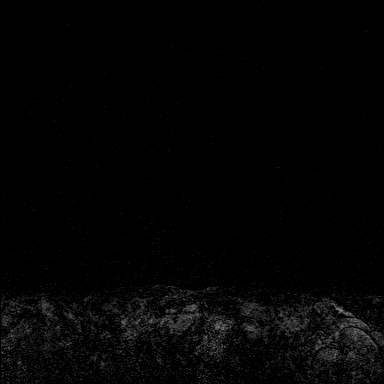

[Series 10: fl3d post-cm 3min_sub · axial · 1.2mm · 0.94mm/px · z∈[-64,-22]mm · 2 of 144 slices shown]
[im 1/144]
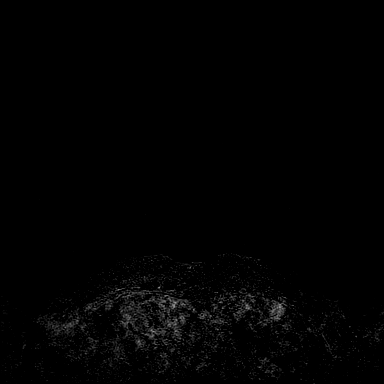
[im 36/144]
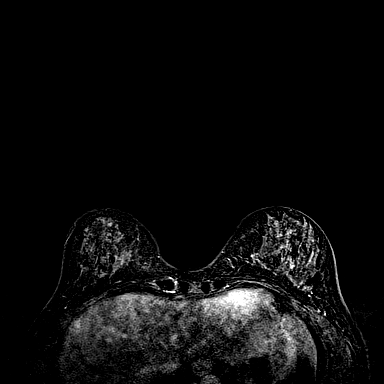

[33 of 48 positions shown; findings below may reference images not displayed]

Three-dimensional MR images were rendered by post-processing of the
original MR data on an independent workstation. The
three-dimensional MR images were interpreted, and findings are
reported in the following complete MRI report for this study. Three
dimensional images were evaluated at the independent interpreting
workstation using the DynaCAD thin client.
FINDINGS: Breast composition: d. Extreme fibroglandular tissue.

Background parenchymal enhancement: Moderate.

Right breast: No mass or abnormal enhancement.

Left breast: An indeterminate 0.8 cm enhancing mass with persistent
kinetics is noted within the UPPER LEFT breast, middle to posterior
thirds (series 13: Image 49)

A 3 x 2.5 x 1.5 cm (AP x transverse x CC) patchy area of non
masslike enhancement with persistent kinetics is identified within
the LOWER LEFT breast, middle 3rd (13: 111-117).

No other mass or suspicious enhancement noted. Scattered foci are
noted.

Lymph nodes: No abnormal appearing lymph nodes.

Ancillary findings:  None.
IMPRESSION: 1. Indeterminate 0.8 cm UPPER LEFT breast mass. Tissue sampling is
recommended.
2. 3 x 2.5 x 1.5 cm patchy non masslike enhancement within the LOWER
LEFT breast. Tissue sampling is recommended.
3. No MR evidence of RIGHT breast malignancy.
4. No abnormal appearing lymph nodes.

RECOMMENDATION:
MR guided biopsy of 0.8 cm UPPER LEFT breast mass and MR guided
biopsy of 3 cm LOWER LEFT breast non masslike enhancement.

BI-RADS CATEGORY  4: Suspicious.

## 2020-07-12 MED ORDER — GADOBUTROL 1 MMOL/ML IV SOLN
6.0000 mL | Freq: Once | INTRAVENOUS | Status: AC | PRN
  Administered 2020-07-12: 6 mL via INTRAVENOUS

## 2020-07-23 ENCOUNTER — Other Ambulatory Visit: Payer: Self-pay | Admitting: Obstetrics and Gynecology

## 2020-07-23 DIAGNOSIS — R9389 Abnormal findings on diagnostic imaging of other specified body structures: Secondary | ICD-10-CM

## 2020-07-28 ENCOUNTER — Ambulatory Visit
Admission: RE | Admit: 2020-07-28 | Discharge: 2020-07-28 | Disposition: A | Discharge status: Home / Self Care | Payer: BC Managed Care – PPO | Source: Ambulatory Visit | Attending: Obstetrics and Gynecology | Admitting: Obstetrics and Gynecology

## 2020-07-28 ENCOUNTER — Other Ambulatory Visit: Payer: Self-pay | Admitting: General Practice

## 2020-07-28 ENCOUNTER — Other Ambulatory Visit: Payer: Self-pay

## 2020-07-28 DIAGNOSIS — R9389 Abnormal findings on diagnostic imaging of other specified body structures: Secondary | ICD-10-CM

## 2020-07-28 DIAGNOSIS — N6012 Diffuse cystic mastopathy of left breast: Secondary | ICD-10-CM | POA: Diagnosis not present

## 2020-07-28 DIAGNOSIS — R921 Mammographic calcification found on diagnostic imaging of breast: Secondary | ICD-10-CM | POA: Diagnosis not present

## 2020-07-28 DIAGNOSIS — N6321 Unspecified lump in the left breast, upper outer quadrant: Secondary | ICD-10-CM | POA: Diagnosis not present

## 2020-07-28 DIAGNOSIS — R928 Other abnormal and inconclusive findings on diagnostic imaging of breast: Secondary | ICD-10-CM | POA: Diagnosis not present

## 2020-07-28 IMAGING — MG MM BREAST LOCALIZATION CLIP
4 series · 4 of 12 positions shown · non-contrast
Comparison: Previous exam(s).

CLINICAL DATA: Post procedure mammogram for clip placement

EXAM:
DIAGNOSTIC LEFT MAMMOGRAM POST MRI BIOPSY

[L ML synth-2D]
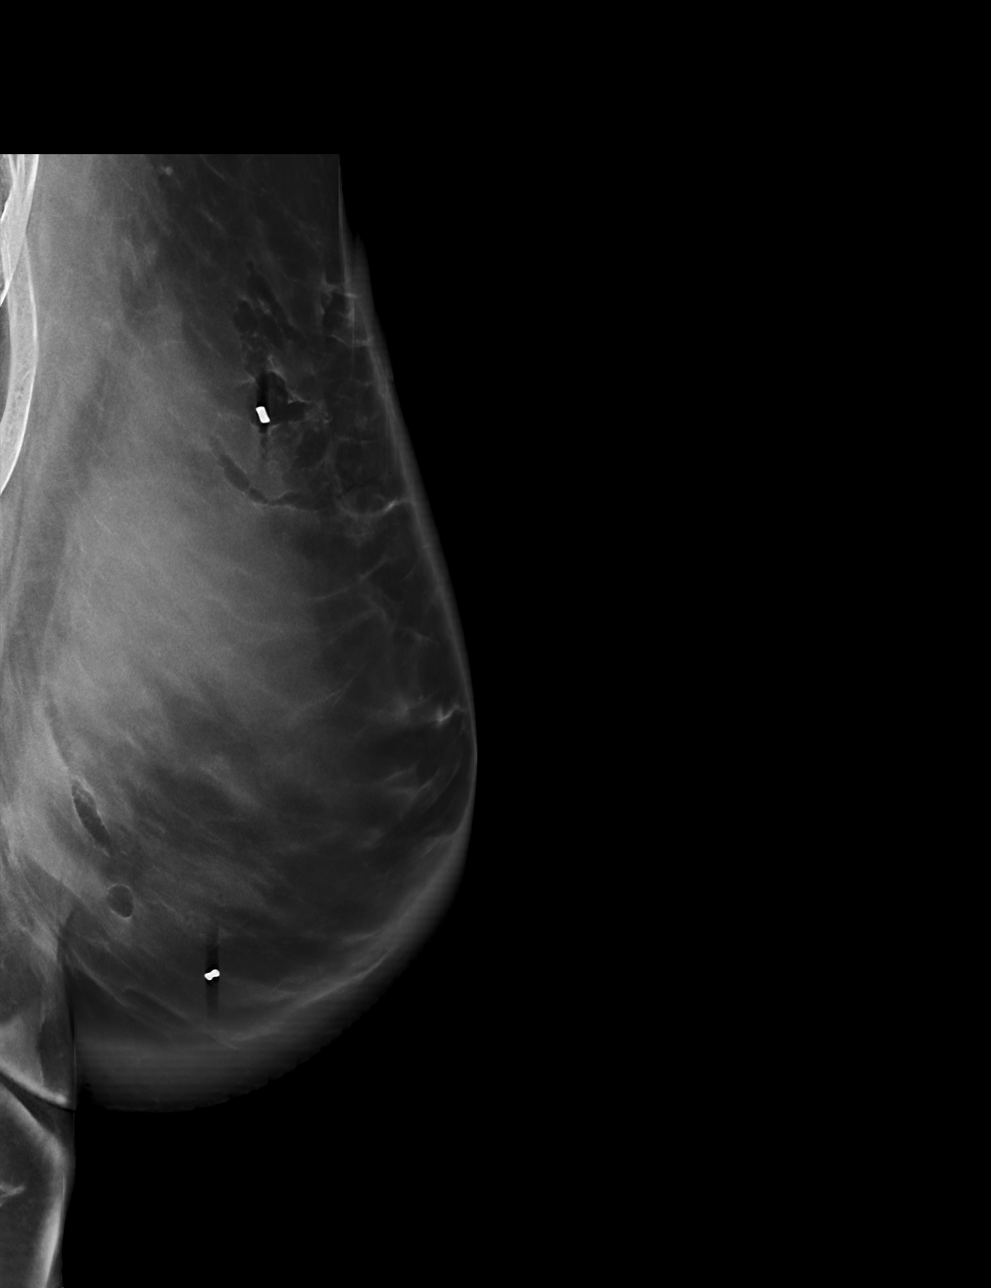

[L CC synth-2D]
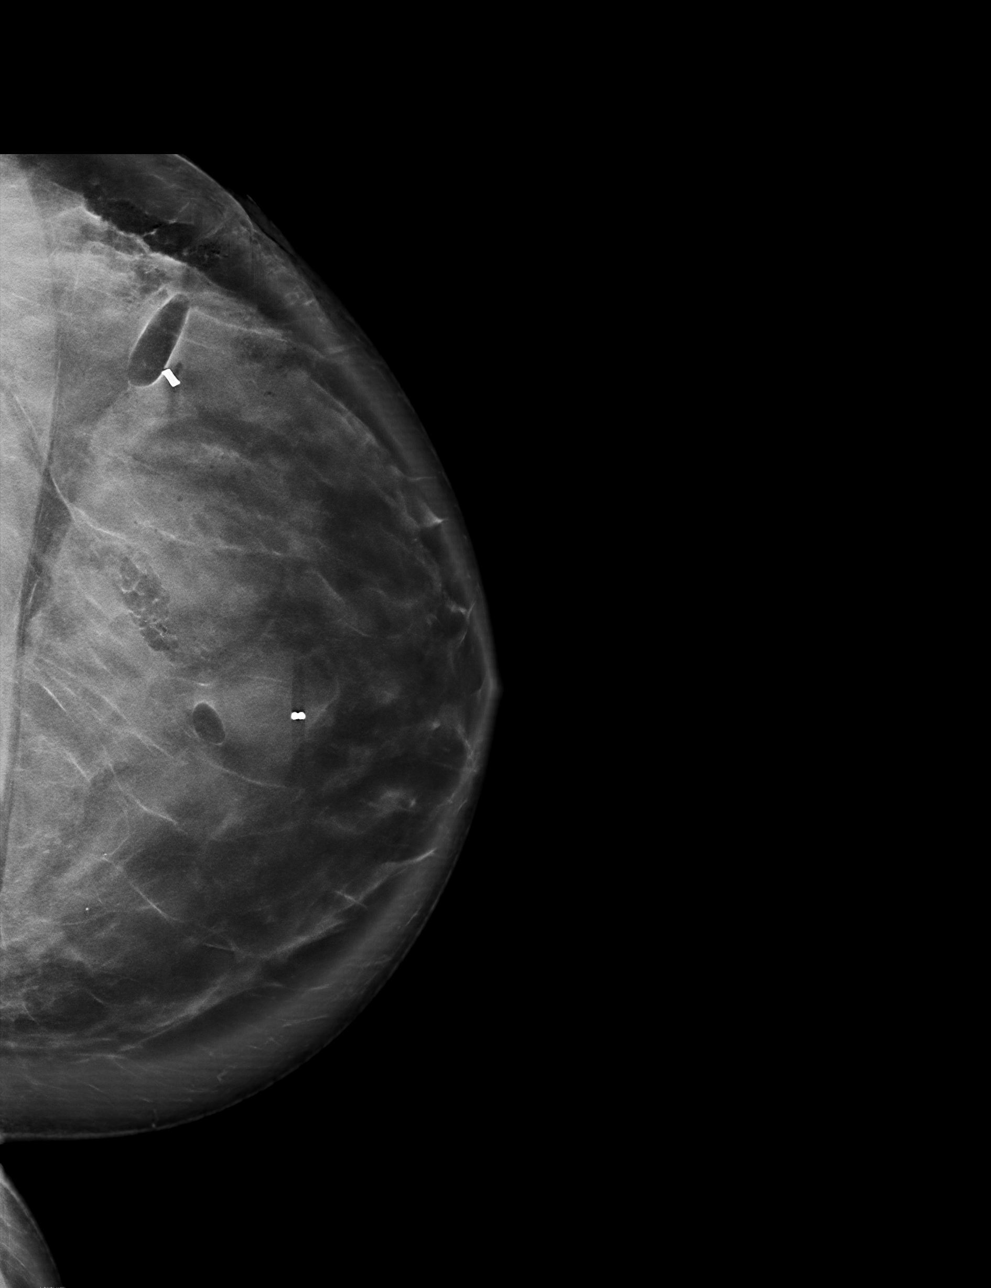

[L CC tomo · tomo slice 51/102.0]
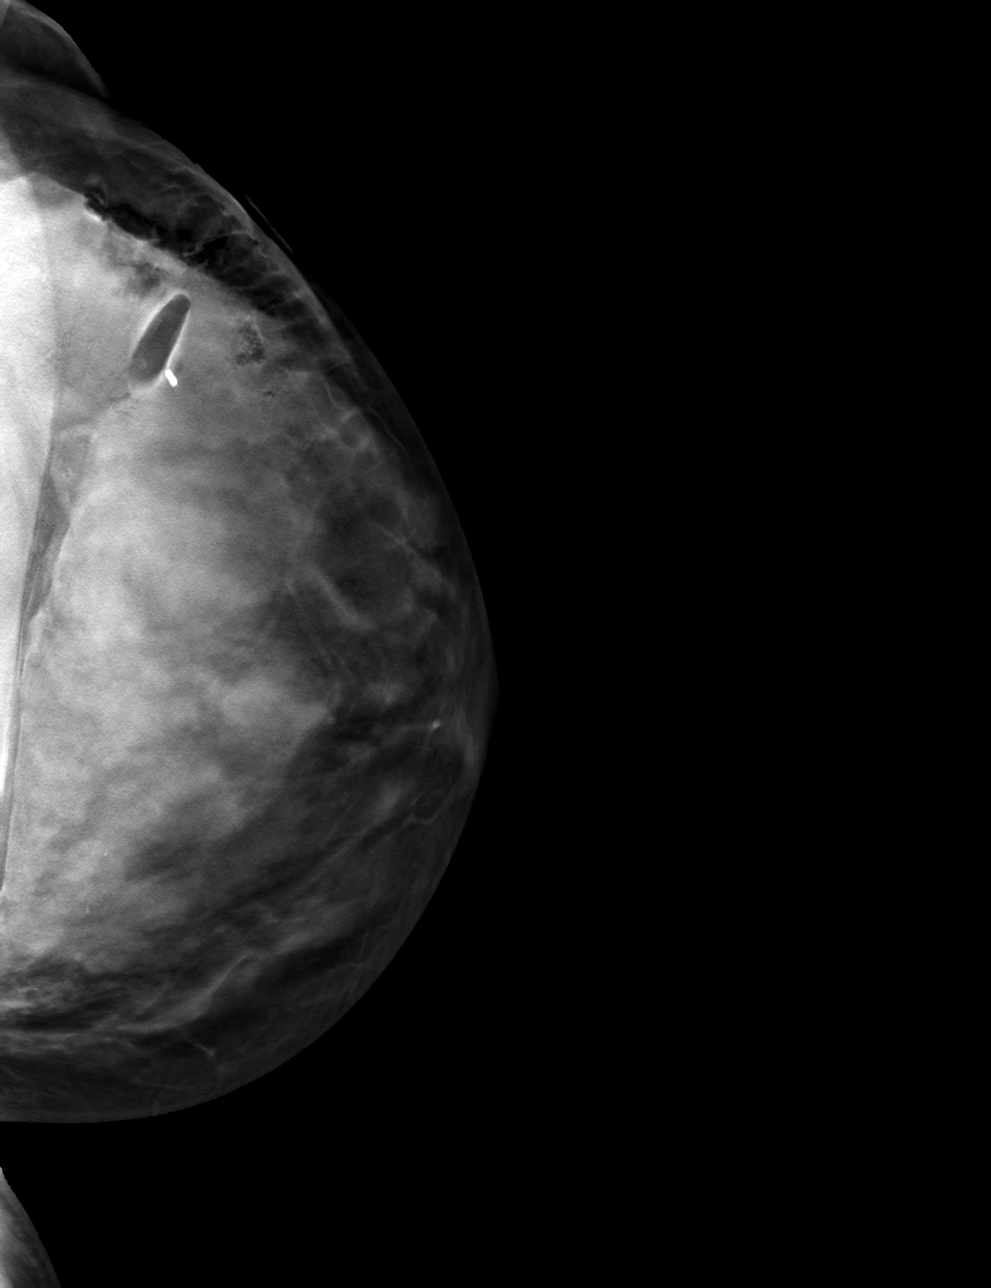

[L ML tomo · tomo slice 61/122.0]
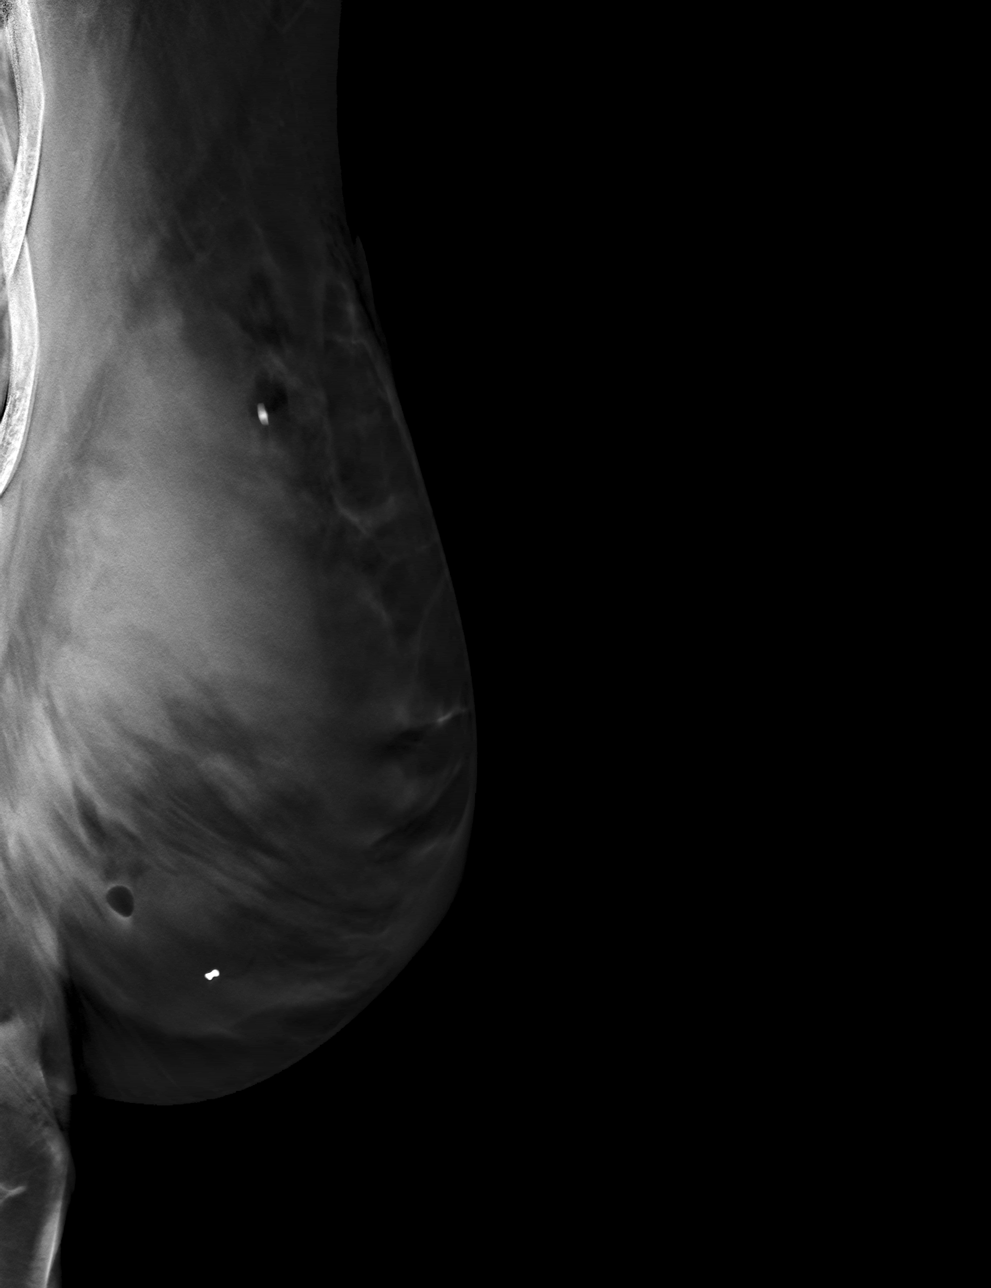

[4 of 12 positions shown; findings below may reference images not displayed]

FINDINGS: 1. Mammographic images were obtained following MRI guided biopsy of
a mass in the upper outer left breast. The cylinder biopsy marking
clip is in expected position at the site of biopsy.

2. Mammographic images were obtained following MRI guided biopsy of
non mass enhancement in the lower central left breast. The barbell
biopsy marking clip is in expected position at the site of biopsy.
IMPRESSION: 1. Appropriate positioning of the cylinder shaped biopsy marking
clip at the site of biopsy in the upper outer left breast.

2. Appropriate positioning of the barbell shaped biopsy marking clip
at the site of biopsy in the lower central left breast.

Final Assessment: Post Procedure Mammograms for Marker Placement

## 2020-07-28 IMAGING — MR MR BREAST BX W LOC DEV EA ADD LESION IMAGE BX SPEC MR GUIDE*L*
7 of 10 series · 32 of 48 positions shown · IV contrast (7 ml gadavist)
Comparison: Previous exams.
COMPARISON: Previous exams.

Addendum:
CLINICAL DATA: 44-year-old female presenting for biopsy of a mass
and of non mass enhancement in the left breast.

EXAM:
MRI GUIDED CORE NEEDLE BIOPSY OF THE LEFT BREAST x 2
TECHNIQUE: Multiplanar, multisequence MR imaging of the left breast was
performed both before and after administration of intravenous
contrast.
CONTRAST:  7mL GADAVIST GADOBUTROL 1 MMOL/ML IV SOLN

[Series 2: fiducial unilateral · sagittal · 2.0mm · 1.33mm/px · 3 of 52 slices shown]
[im 1/52]
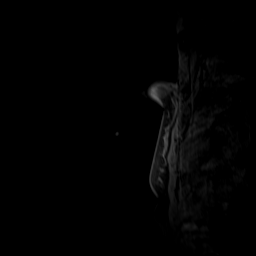
[im 26/52]
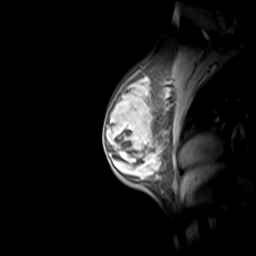
[im 52/52]
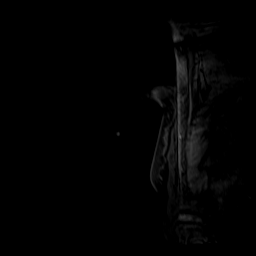

[Series 4: dynamic pre · axial · non-contrast · 1.3mm · 0.73mm/px · z∈[-104,+124]mm · 5 of 176 slices shown]
[im 1/176]
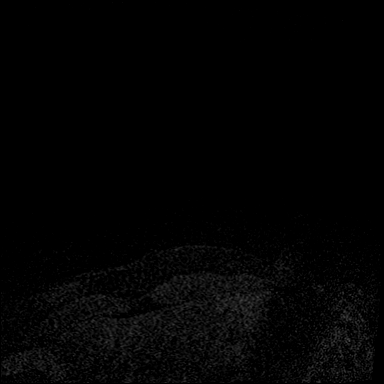
[im 44/176]
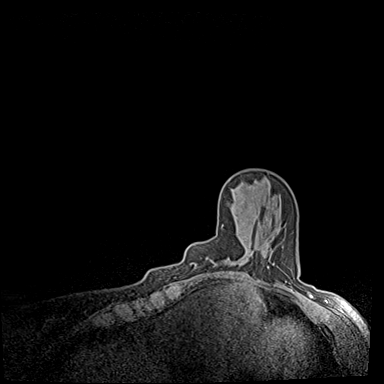
[im 88/176]
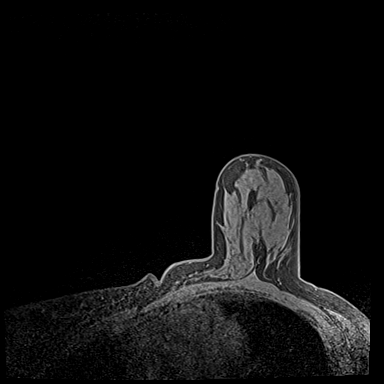
[im 132/176]
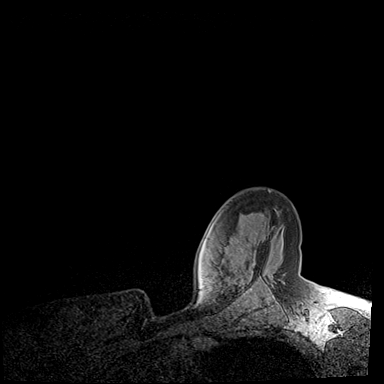
[im 176/176]
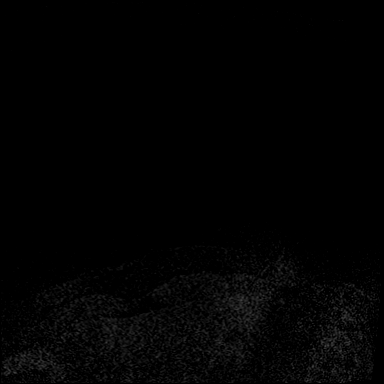

[Series 5: dynamic post 20 · axial · 1.3mm · 0.73mm/px · z∈[-104,+124]mm · 5 of 176 slices shown (1 of 2)]
[im 1/176]
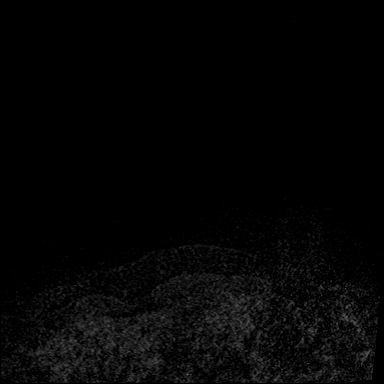
[im 44/176]
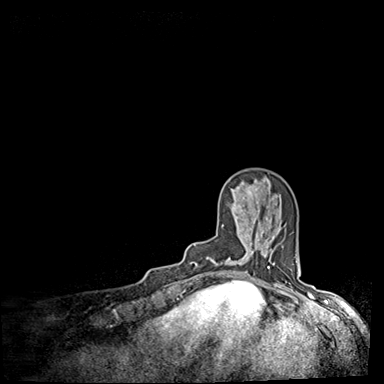
[im 88/176]
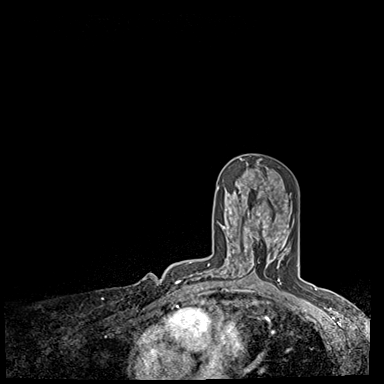
[im 132/176]
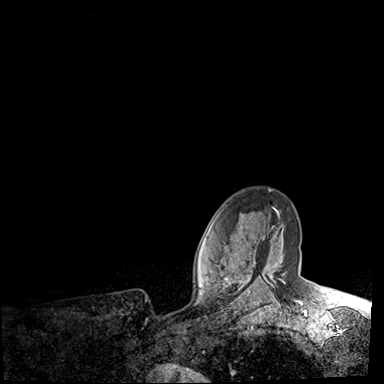
[im 176/176]
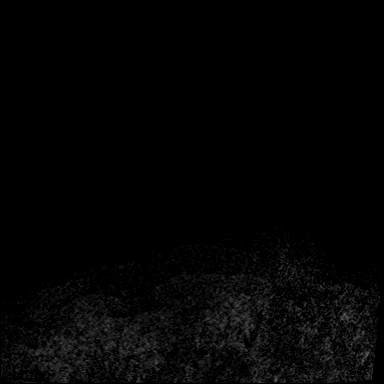

[Series 6: dynamic post 20 · axial · 1.3mm · 0.73mm/px · z∈[-104,+124]mm · 5 of 176 slices shown (2 of 2)]
[im 1/176]
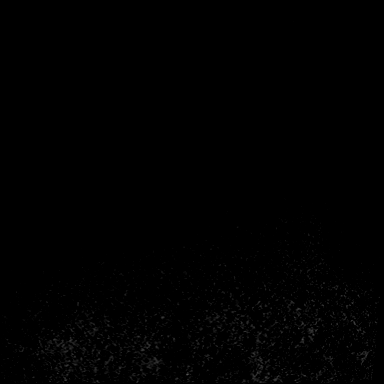
[im 44/176]
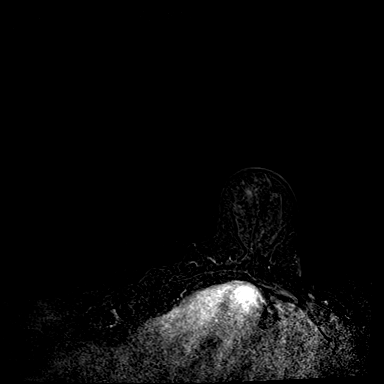
[im 88/176]
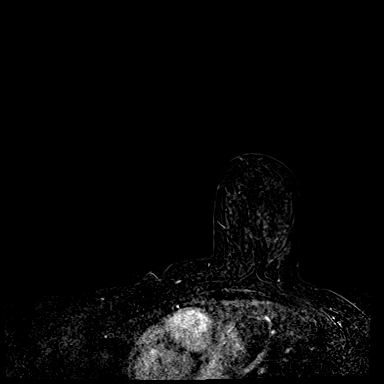
[im 132/176]
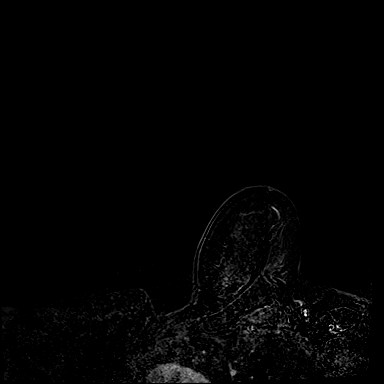
[im 176/176]
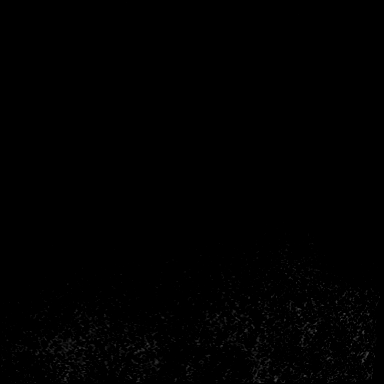

[Series 7: dynamic post 3 · axial · 1.3mm · 0.73mm/px · z∈[-104,+124]mm · 5 of 176 slices shown (1 of 2)]
[im 1/176]
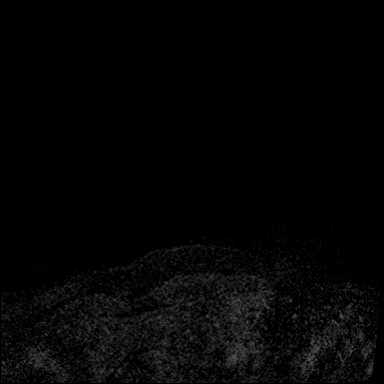
[im 44/176]
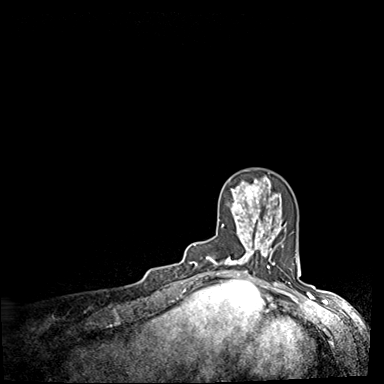
[im 88/176]
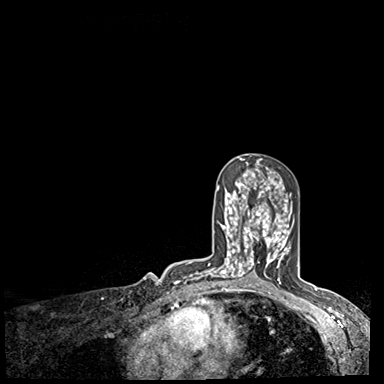
[im 132/176]
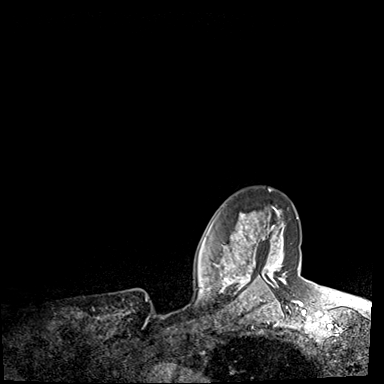
[im 176/176]
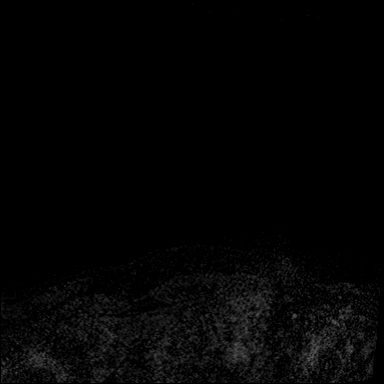

[Series 8: dynamic post 3 · axial · 1.3mm · 0.73mm/px · z∈[-104,+124]mm · 5 of 176 slices shown (2 of 2)]
[im 1/176]
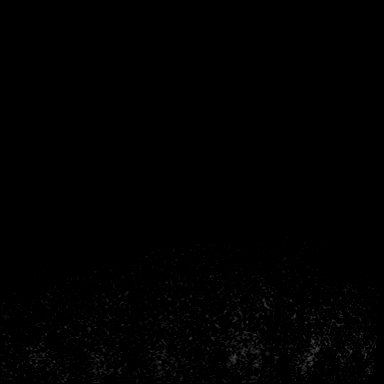
[im 44/176]
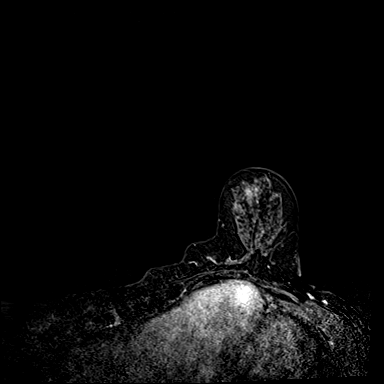
[im 88/176]
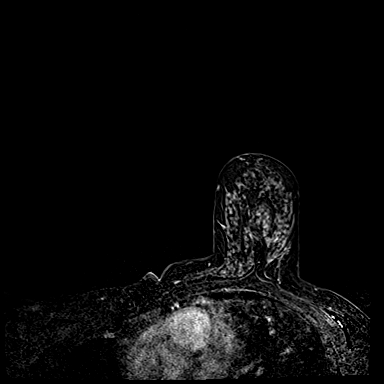
[im 132/176]
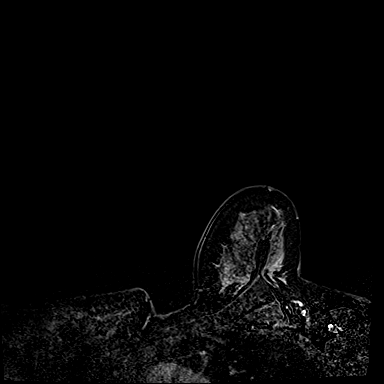
[im 176/176]
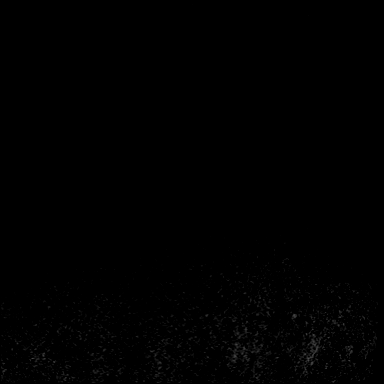

[Series 9: needle confirmation · axial · 1.3mm · 0.73mm/px · z∈[-104,+67]mm · 4 of 176 slices shown]
[im 1/176]
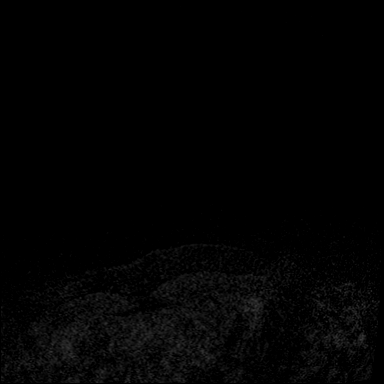
[im 44/176]
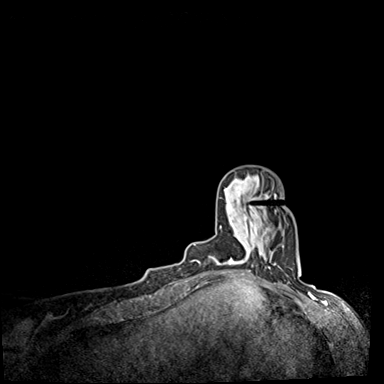
[im 88/176]
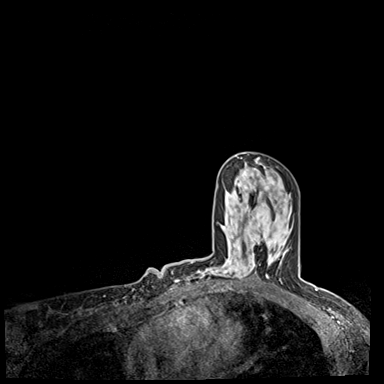
[im 132/176]
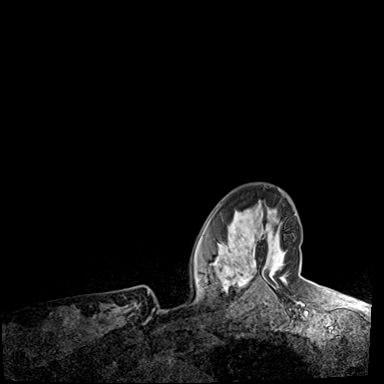

[32 of 48 positions shown; findings below may reference images not displayed]

FINDINGS: I met with the patient, and we discussed the procedure of MRI guided
biopsy, including risks, benefits, and alternatives. Specifically,
we discussed the risks of infection, bleeding, tissue injury, clip
migration, and inadequate sampling. Informed, written consent was
given. The usual time out protocol was performed immediately prior
to the procedure.

1. Using sterile technique, 1% Lidocaine, MRI guidance, and a 9
gauge vacuum assisted device, biopsy was performed of a mass in the
upper outer left breast using a lateral approach. At the conclusion
of the procedure, a cylinder tissue marker clip was deployed into
the biopsy cavity. Follow-up 2-view mammogram was performed and
dictated separately.

2. Using sterile technique, 1% Lidocaine, MRI guidance, and a 9
gauge vacuum assisted device, biopsy was performed of non mass
enhancement in the lower central left breast using a lateral
approach. At the conclusion of the procedure, a a barbell tissue
marker clip was deployed into the biopsy cavity. Follow-up 2-view
mammogram was performed and dictated separately.
IMPRESSION: MRI guided biopsy of a mass in the upper outer left breast and of
non mass enhancement in the lower central left breast. No apparent
complications.

ADDENDUM:
Pathology revealed FIBROCYSTIC AND FIBROADENOMATOID CHANGE of the
Left breast, upper outer. This was found to be concordant by Dr.
IN.

Pathology revealed FIBROCYSTIC AND FIBROADENOMATOID CHANGE of the
Left breast, lower central. This was found to be concordant by Dr.
IN.

Pathology results were discussed with the patient by telephone. The
patient reported doing well after the biopsies with tenderness at
the sites. Post biopsy instructions and care were reviewed and
questions were answered. The patient was encouraged to call The

The patient was instructed to return for a bilateral breast MRI in 6
months, per protocol, and to continue with annual mammography.

Pathology results reported by IN, RN on [DATE].

*** End of Addendum ***
FINDINGS: I met with the patient, and we discussed the procedure of MRI guided
biopsy, including risks, benefits, and alternatives. Specifically,
we discussed the risks of infection, bleeding, tissue injury, clip
migration, and inadequate sampling. Informed, written consent was
given. The usual time out protocol was performed immediately prior
to the procedure.

1. Using sterile technique, 1% Lidocaine, MRI guidance, and a 9
gauge vacuum assisted device, biopsy was performed of a mass in the
upper outer left breast using a lateral approach. At the conclusion
of the procedure, a cylinder tissue marker clip was deployed into
the biopsy cavity. Follow-up 2-view mammogram was performed and
dictated separately.

2. Using sterile technique, 1% Lidocaine, MRI guidance, and a 9
gauge vacuum assisted device, biopsy was performed of non mass
enhancement in the lower central left breast using a lateral
approach. At the conclusion of the procedure, a a barbell tissue
marker clip was deployed into the biopsy cavity. Follow-up 2-view
mammogram was performed and dictated separately.
IMPRESSION: MRI guided biopsy of a mass in the upper outer left breast and of
non mass enhancement in the lower central left breast. No apparent
complications.

## 2020-07-28 MED ORDER — GADOBUTROL 1 MMOL/ML IV SOLN
7.0000 mL | Freq: Once | INTRAVENOUS | Status: AC | PRN
  Administered 2020-07-28: 7 mL via INTRAVENOUS

## 2020-12-15 DIAGNOSIS — Z1231 Encounter for screening mammogram for malignant neoplasm of breast: Secondary | ICD-10-CM | POA: Diagnosis not present

## 2021-04-21 DIAGNOSIS — R051 Acute cough: Secondary | ICD-10-CM | POA: Diagnosis not present

## 2021-06-27 ENCOUNTER — Other Ambulatory Visit: Payer: Self-pay | Admitting: Obstetrics and Gynecology

## 2021-06-27 DIAGNOSIS — N6012 Diffuse cystic mastopathy of left breast: Secondary | ICD-10-CM

## 2021-07-01 DIAGNOSIS — Z1329 Encounter for screening for other suspected endocrine disorder: Secondary | ICD-10-CM | POA: Diagnosis not present

## 2021-07-01 DIAGNOSIS — E559 Vitamin D deficiency, unspecified: Secondary | ICD-10-CM | POA: Diagnosis not present

## 2021-07-01 DIAGNOSIS — Z6824 Body mass index (BMI) 24.0-24.9, adult: Secondary | ICD-10-CM | POA: Diagnosis not present

## 2021-07-01 DIAGNOSIS — Z01419 Encounter for gynecological examination (general) (routine) without abnormal findings: Secondary | ICD-10-CM | POA: Diagnosis not present

## 2021-07-06 DIAGNOSIS — Z23 Encounter for immunization: Secondary | ICD-10-CM | POA: Diagnosis not present

## 2021-07-17 ENCOUNTER — Ambulatory Visit
Admission: RE | Admit: 2021-07-17 | Discharge: 2021-07-17 | Disposition: A | Discharge status: Home / Self Care | Payer: BC Managed Care – PPO | Source: Ambulatory Visit | Attending: Obstetrics and Gynecology | Admitting: Obstetrics and Gynecology

## 2021-07-17 ENCOUNTER — Other Ambulatory Visit: Payer: Self-pay

## 2021-07-17 DIAGNOSIS — N6012 Diffuse cystic mastopathy of left breast: Secondary | ICD-10-CM

## 2021-07-17 DIAGNOSIS — R928 Other abnormal and inconclusive findings on diagnostic imaging of breast: Secondary | ICD-10-CM | POA: Diagnosis not present

## 2021-07-17 DIAGNOSIS — Z803 Family history of malignant neoplasm of breast: Secondary | ICD-10-CM | POA: Diagnosis not present

## 2021-07-17 IMAGING — MR MR BREAST BILAT WO/W CM
8 of 12 series · 33 of 48 positions shown · IV contrast (gadavist)
Comparison: Prior films

CLINICAL DATA: Short-term follow-up benign MR guided core biopsies
of left breast. Family history of breast cancer.

EXAM:
BILATERAL BREAST MRI WITH AND WITHOUT CONTRAST
TECHNIQUE: Multiplanar, multisequence MR images of both breasts were obtained
prior to and following the intravenous administration of 7 ml of
Gadavist

[Series 2: t2_tirm_tra ipat (a-p) · axial · 3.0mm · 0.66mm/px · 1 of 60 slices shown]
[im 1/60]
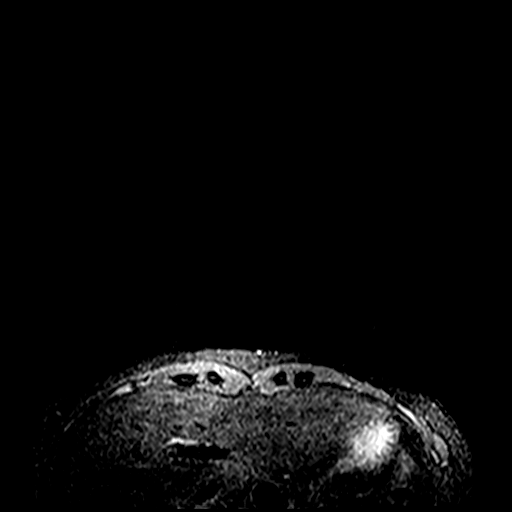

[Series 3: fl3d pre-cm no · axial · non-contrast · 1.2mm · 0.89mm/px · z∈[-92,+79]mm · 5 of 144 slices shown]
[im 1/144]
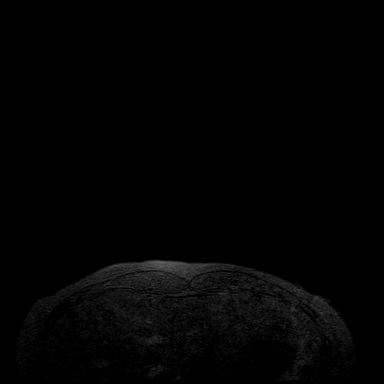
[im 36/144]
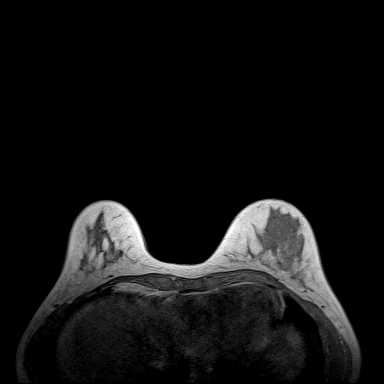
[im 72/144]
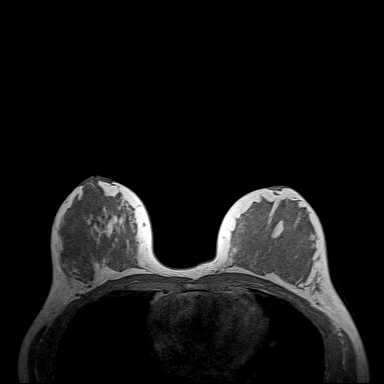
[im 108/144]
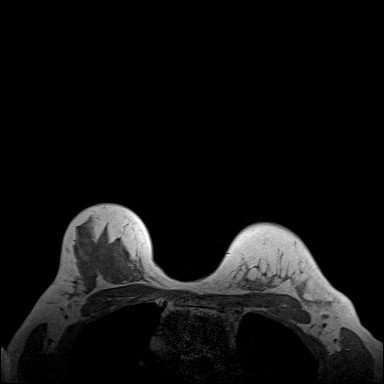
[im 144/144]
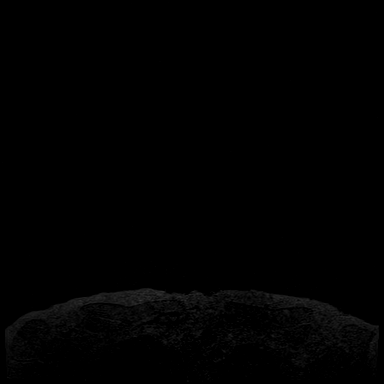

[Series 4: fl3d pre-cm · axial · non-contrast · 1.2mm · 0.89mm/px · z∈[-92,+79]mm · 5 of 144 slices shown]
[im 1/144]
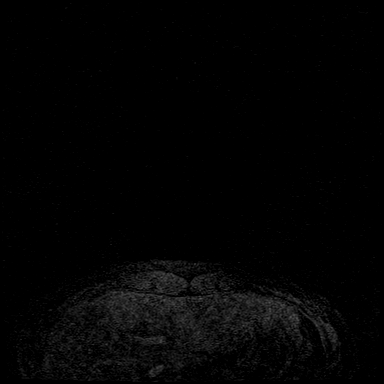
[im 36/144]
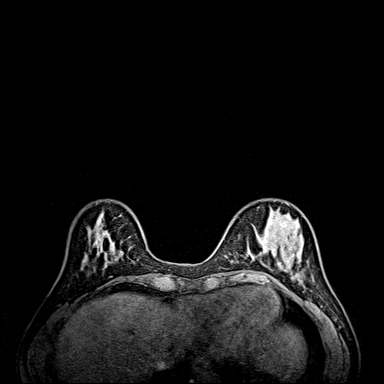
[im 72/144]
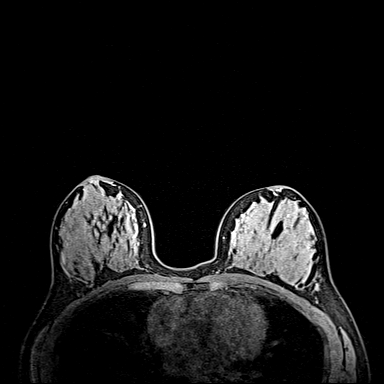
[im 108/144]
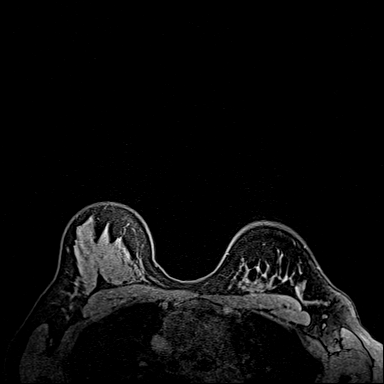
[im 144/144]
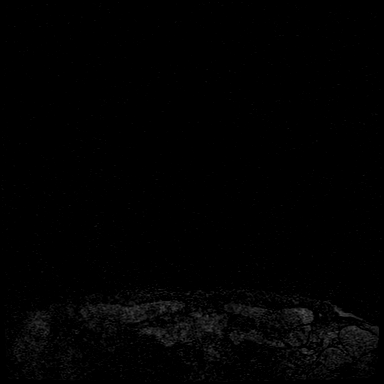

[Series 5: fl3d post immediate · axial · 1.2mm · 0.89mm/px · z∈[-92,+79]mm · 5 of 144 slices shown (1 of 3)]
[im 1/144]
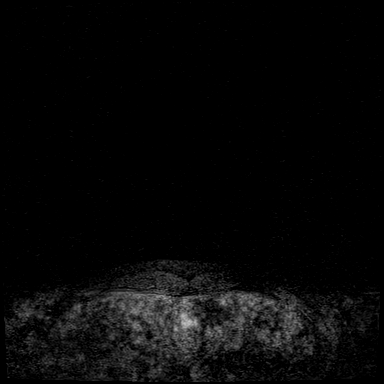
[im 36/144]
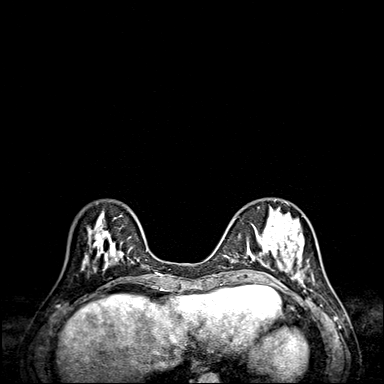
[im 72/144]
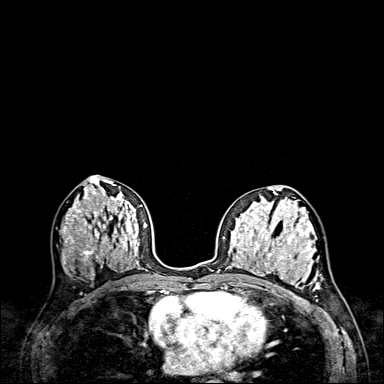
[im 108/144]
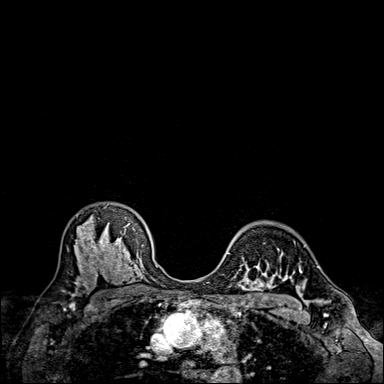
[im 144/144]
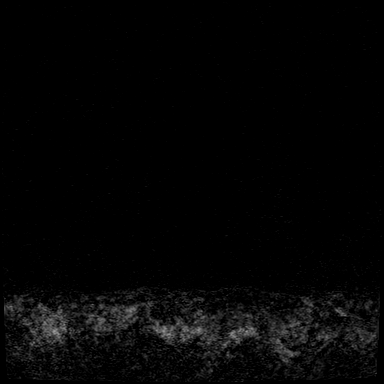

[Series 6: fl3d post immediate · axial · 1.2mm · 0.89mm/px · z∈[-92,+79]mm · 5 of 144 slices shown (2 of 3)]
[im 1/144]
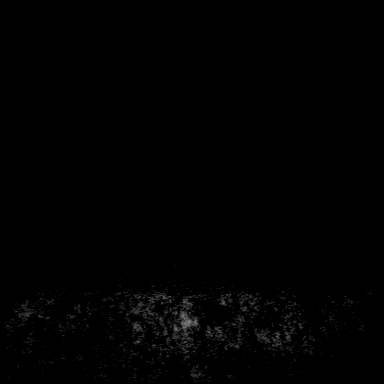
[im 36/144]
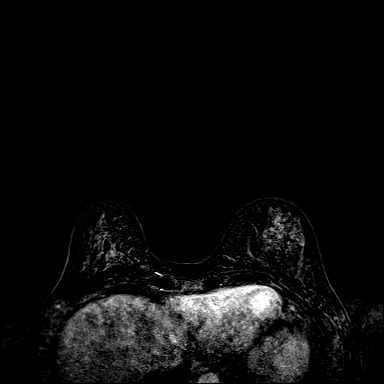
[im 72/144]
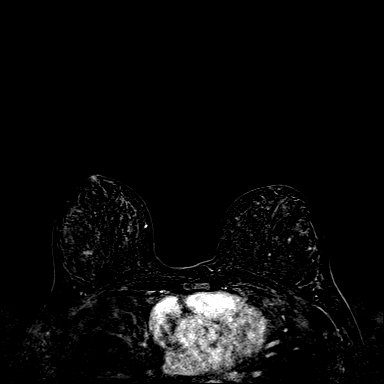
[im 108/144]
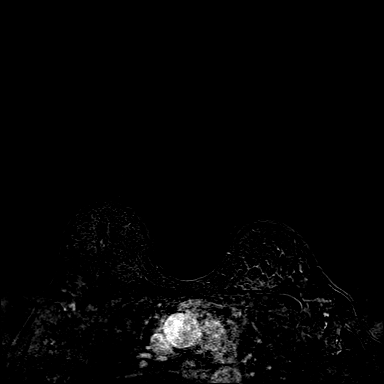
[im 144/144]
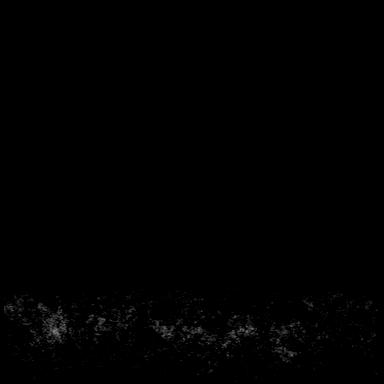

[Series 7: fl3d post immediate · axial · 172.8mm · 0.89mm/px · 1 of 1 slices shown (3 of 3)]
[im 1/1]
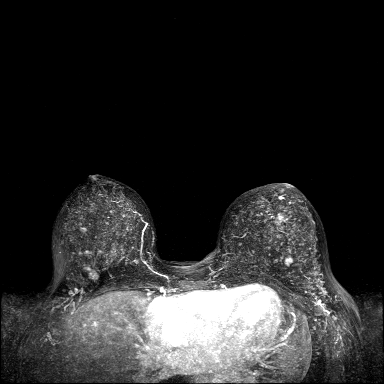

[Series 8: fl3d post 3min · axial · 1.2mm · 0.89mm/px · z∈[-92,+79]mm · 6 of 144 slices shown]
[im 1/144]
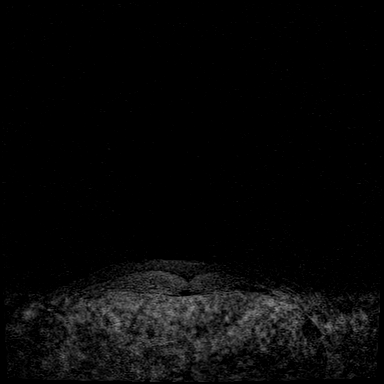
[im 29/144]
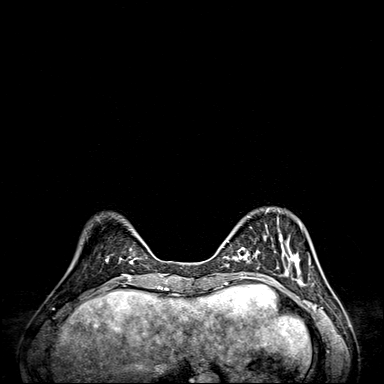
[im 58/144]
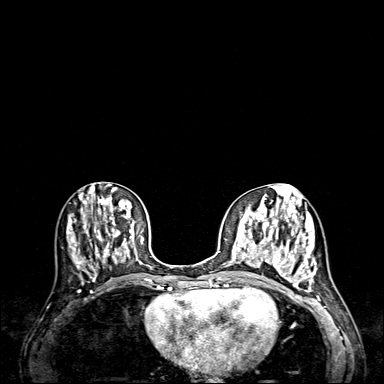
[im 86/144]
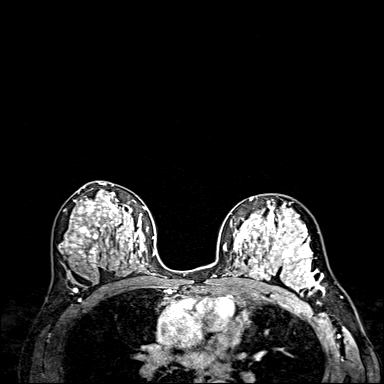
[im 115/144]
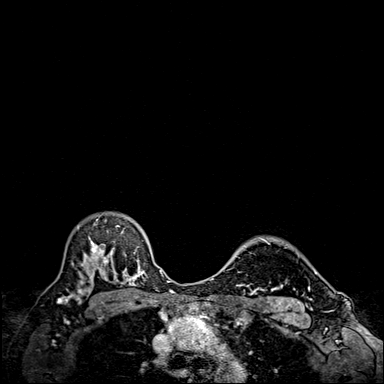
[im 144/144]
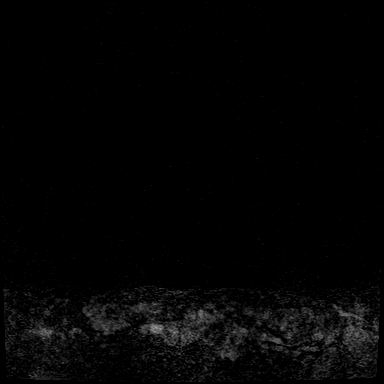

[Series 9: fl3d post 3min_sub · axial · 1.2mm · 0.89mm/px · z∈[-92,+45]mm · 5 of 144 slices shown]
[im 1/144]
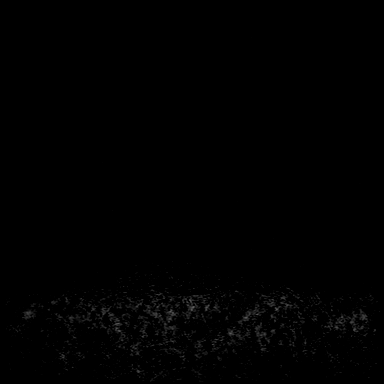
[im 29/144]
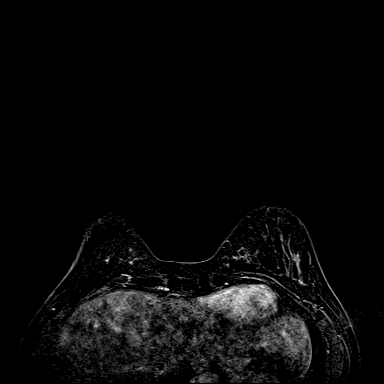
[im 58/144]
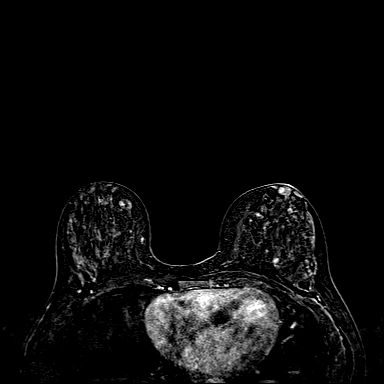
[im 86/144]
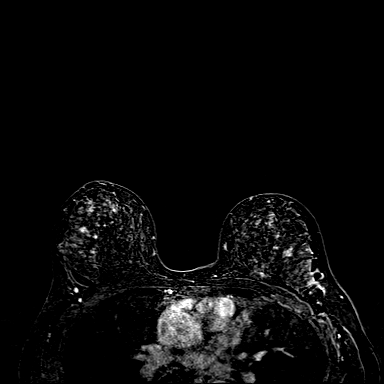
[im 115/144]
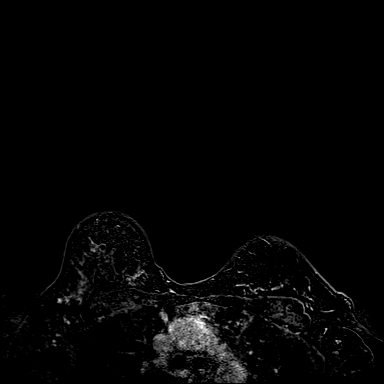

[33 of 48 positions shown; findings below may reference images not displayed]

Three-dimensional MR images were rendered by post-processing of the
original MR data on an independent workstation. The
three-dimensional MR images were interpreted, and findings are
reported in the following complete MRI report for this study. Three
dimensional images were evaluated at the independent interpreting
workstation using the DynaCAD thin client.
FINDINGS: Breast composition: d. Extreme fibroglandular tissue.

Background parenchymal enhancement: Moderate.

Right breast: Scattered foci of enhancements are identified
throughout right breast unchanged compared to prior exam.

Left breast: Scattered foci of enhancement are identified throughout
left breast unchanged compared prior exam. The previously biopsies
areas of left breast are stable.

Lymph nodes: No abnormal appearing lymph nodes.

Ancillary findings:  None.
IMPRESSION: Benign findings.

RECOMMENDATION:
Annual screening MRI in 1 year. Also recommend routine screening
mammogram back on schedule.

BI-RADS CATEGORY  2: Benign.

## 2021-07-17 MED ORDER — GADOBUTROL 1 MMOL/ML IV SOLN
7.0000 mL | Freq: Once | INTRAVENOUS | Status: AC | PRN
  Administered 2021-07-17: 12:00:00 7 mL via INTRAVENOUS

## 2021-08-02 DIAGNOSIS — M549 Dorsalgia, unspecified: Secondary | ICD-10-CM | POA: Diagnosis not present

## 2021-08-31 DIAGNOSIS — Z20822 Contact with and (suspected) exposure to covid-19: Secondary | ICD-10-CM | POA: Diagnosis not present

## 2021-08-31 DIAGNOSIS — Z01812 Encounter for preprocedural laboratory examination: Secondary | ICD-10-CM | POA: Diagnosis not present

## 2021-09-20 DIAGNOSIS — M545 Low back pain, unspecified: Secondary | ICD-10-CM | POA: Diagnosis not present

## 2021-09-21 ENCOUNTER — Ambulatory Visit
Admission: RE | Admit: 2021-09-21 | Discharge: 2021-09-21 | Disposition: A | Discharge status: Home / Self Care | Payer: BC Managed Care – PPO | Source: Ambulatory Visit | Attending: Sports Medicine | Admitting: Sports Medicine

## 2021-09-21 ENCOUNTER — Other Ambulatory Visit: Payer: Self-pay | Admitting: Sports Medicine

## 2021-09-21 DIAGNOSIS — M545 Low back pain, unspecified: Secondary | ICD-10-CM | POA: Diagnosis not present

## 2021-09-21 IMAGING — CR DG LUMBAR SPINE 2-3V
3 series · 3 of 3 positions shown · non-contrast
Comparison: None.

CLINICAL DATA: Back pain radiating into the left leg, initial
encounter

EXAM:
LUMBAR SPINE - 2-3 VIEW

[w lumbar spine ap]
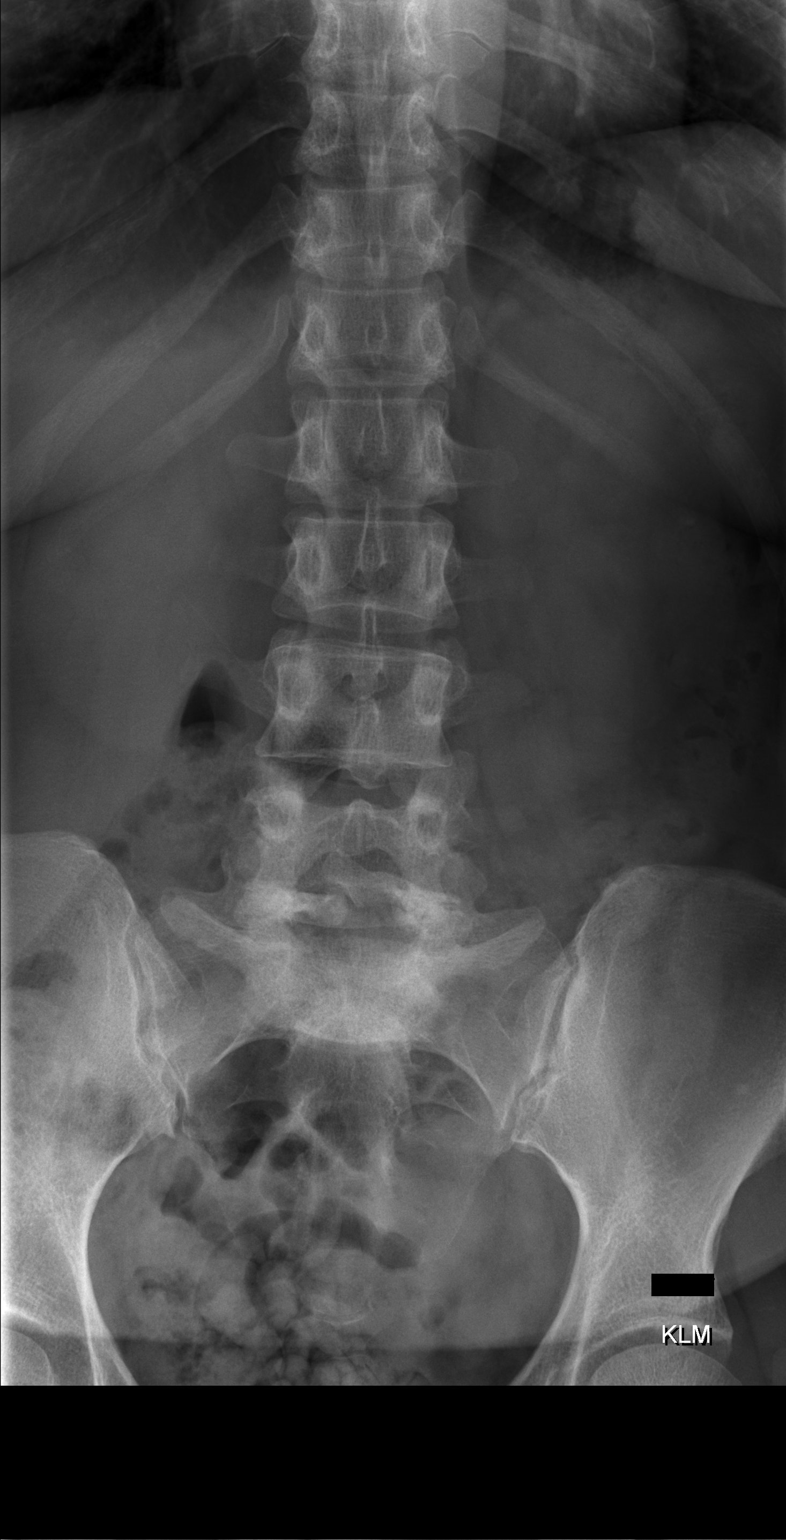

[w lumbar spine lat]
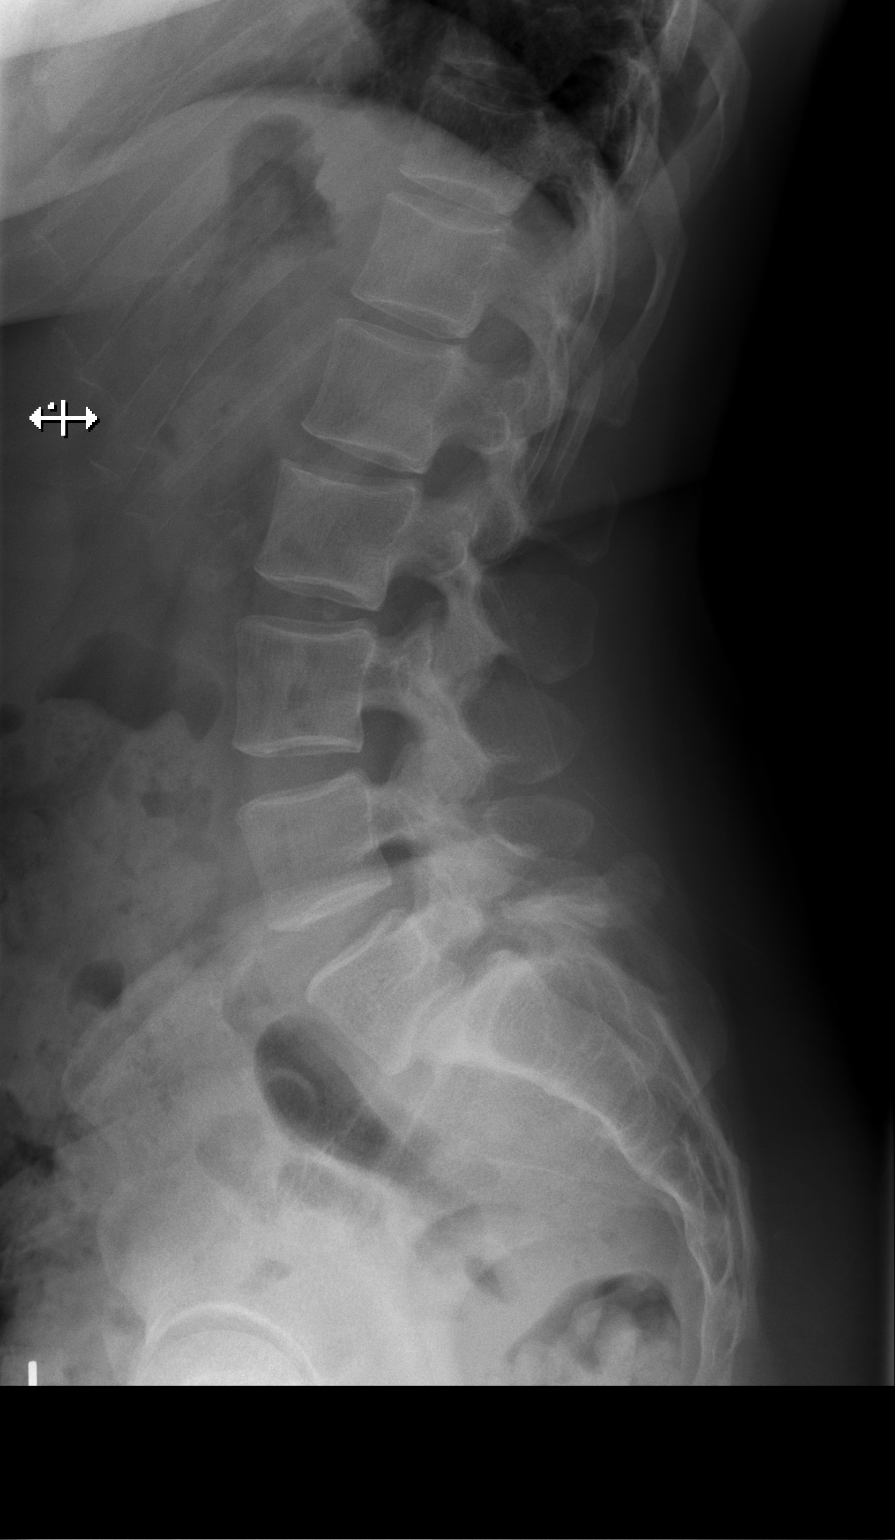

[w lumbar l-5 s-1 spot]
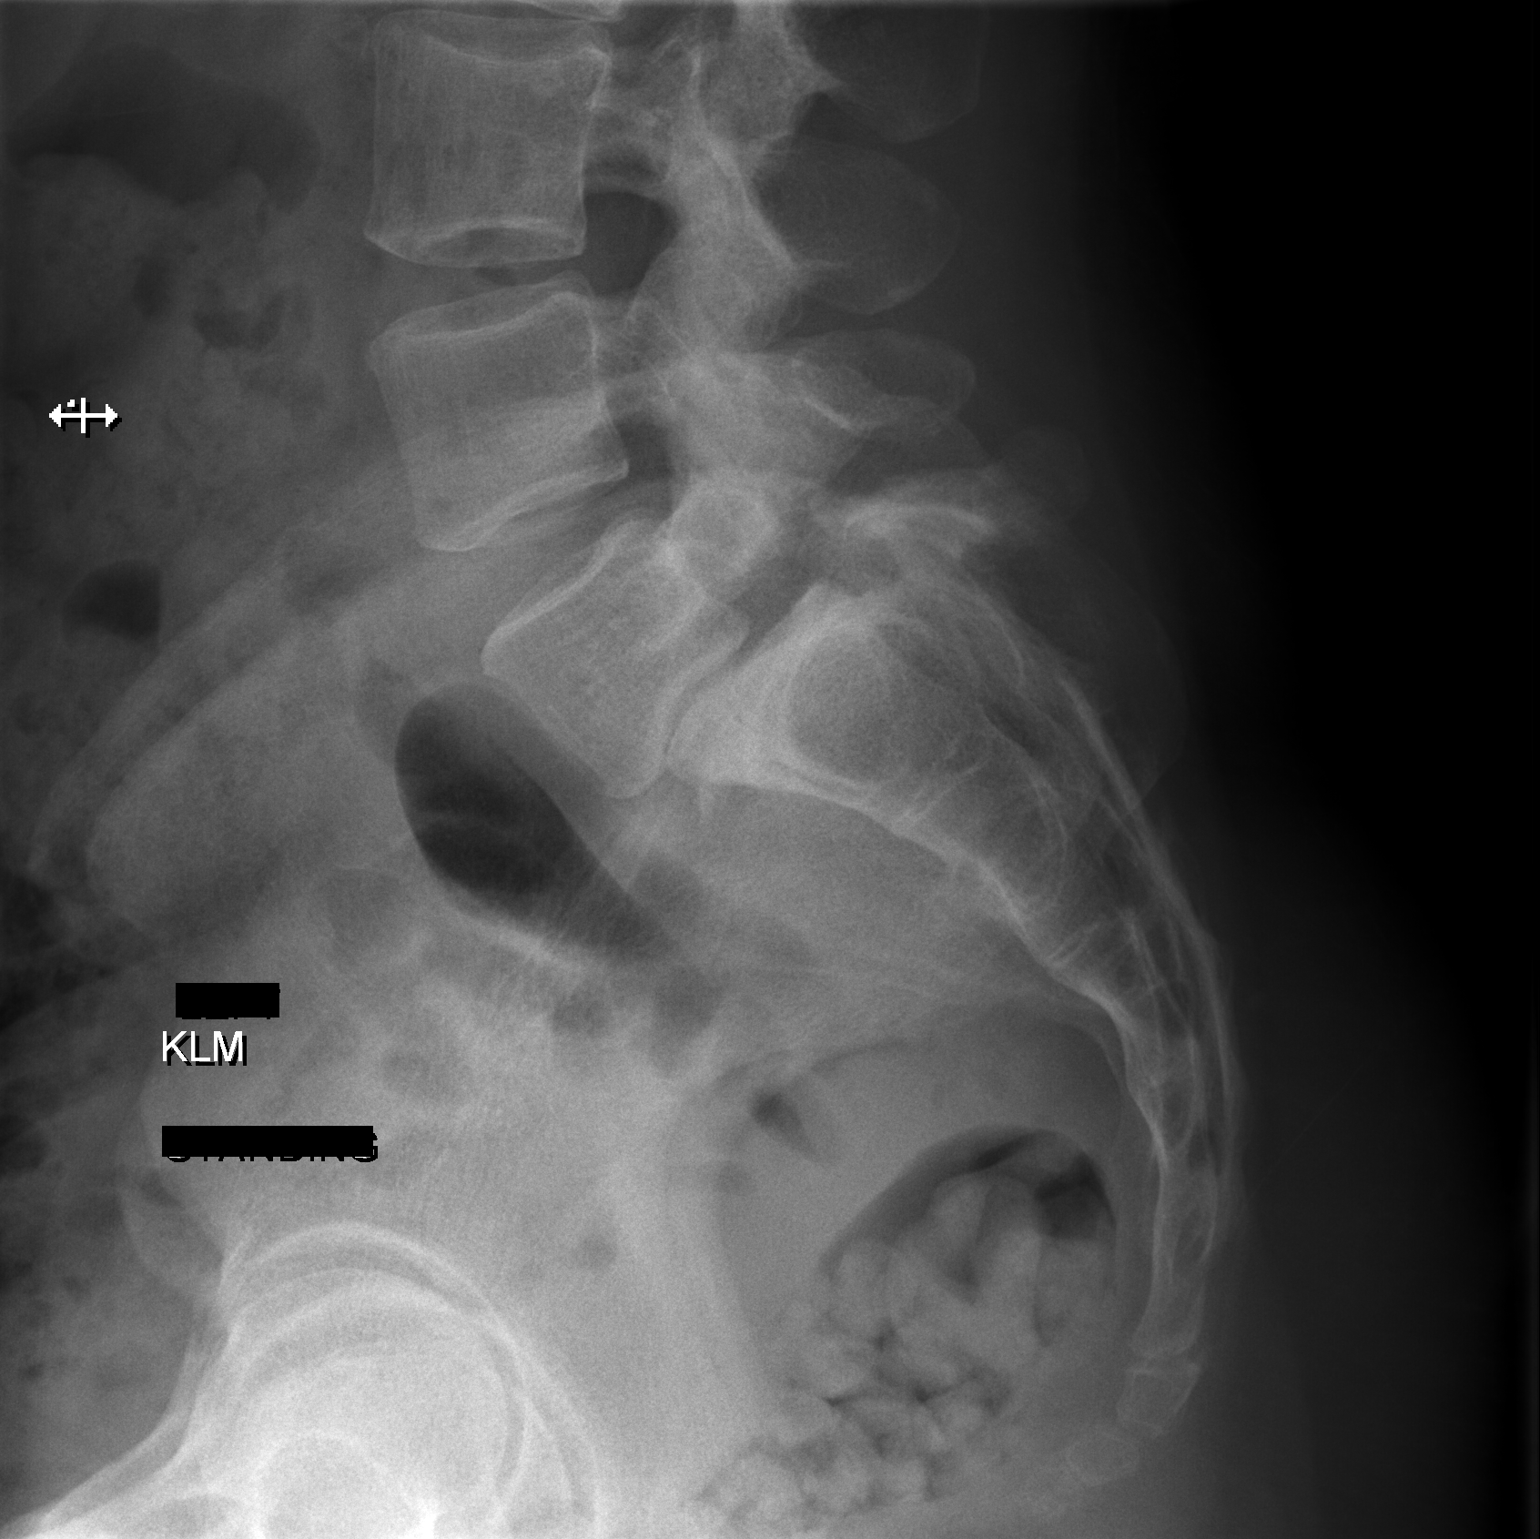

[3 of 3 positions shown; findings below may reference images not displayed]

FINDINGS: Five lumbar type vertebral bodies are well visualized. Bilateral
pars defects are noted at L5 with grade 2 anterolisthesis of L5 on
S1. Vertebral body height is well maintained. No soft tissue
abnormality is seen. Posterior fusion defect at L5 is noted as well.
IMPRESSION: Pars defects at L5 with anterolisthesis of L5 on S1.

## 2021-09-29 DIAGNOSIS — R531 Weakness: Secondary | ICD-10-CM | POA: Diagnosis not present

## 2021-09-29 DIAGNOSIS — M79652 Pain in left thigh: Secondary | ICD-10-CM | POA: Diagnosis not present

## 2021-09-29 DIAGNOSIS — M2560 Stiffness of unspecified joint, not elsewhere classified: Secondary | ICD-10-CM | POA: Diagnosis not present

## 2021-09-29 DIAGNOSIS — M545 Low back pain, unspecified: Secondary | ICD-10-CM | POA: Diagnosis not present

## 2021-09-30 DIAGNOSIS — R3121 Asymptomatic microscopic hematuria: Secondary | ICD-10-CM | POA: Diagnosis not present

## 2021-10-03 DIAGNOSIS — M79652 Pain in left thigh: Secondary | ICD-10-CM | POA: Diagnosis not present

## 2021-10-03 DIAGNOSIS — R531 Weakness: Secondary | ICD-10-CM | POA: Diagnosis not present

## 2021-10-03 DIAGNOSIS — M545 Low back pain, unspecified: Secondary | ICD-10-CM | POA: Diagnosis not present

## 2021-10-03 DIAGNOSIS — M2560 Stiffness of unspecified joint, not elsewhere classified: Secondary | ICD-10-CM | POA: Diagnosis not present

## 2021-10-05 DIAGNOSIS — M2560 Stiffness of unspecified joint, not elsewhere classified: Secondary | ICD-10-CM | POA: Diagnosis not present

## 2021-10-05 DIAGNOSIS — M545 Low back pain, unspecified: Secondary | ICD-10-CM | POA: Diagnosis not present

## 2021-10-05 DIAGNOSIS — R531 Weakness: Secondary | ICD-10-CM | POA: Diagnosis not present

## 2021-10-05 DIAGNOSIS — M79652 Pain in left thigh: Secondary | ICD-10-CM | POA: Diagnosis not present

## 2021-10-10 DIAGNOSIS — H9193 Unspecified hearing loss, bilateral: Secondary | ICD-10-CM | POA: Diagnosis not present

## 2021-10-10 DIAGNOSIS — M2560 Stiffness of unspecified joint, not elsewhere classified: Secondary | ICD-10-CM | POA: Diagnosis not present

## 2021-10-10 DIAGNOSIS — R531 Weakness: Secondary | ICD-10-CM | POA: Diagnosis not present

## 2021-10-10 DIAGNOSIS — M545 Low back pain, unspecified: Secondary | ICD-10-CM | POA: Diagnosis not present

## 2021-10-10 DIAGNOSIS — Q909 Down syndrome, unspecified: Secondary | ICD-10-CM | POA: Diagnosis not present

## 2021-10-10 DIAGNOSIS — M79652 Pain in left thigh: Secondary | ICD-10-CM | POA: Diagnosis not present

## 2021-10-12 DIAGNOSIS — M79652 Pain in left thigh: Secondary | ICD-10-CM | POA: Diagnosis not present

## 2021-10-12 DIAGNOSIS — R531 Weakness: Secondary | ICD-10-CM | POA: Diagnosis not present

## 2021-10-12 DIAGNOSIS — M2560 Stiffness of unspecified joint, not elsewhere classified: Secondary | ICD-10-CM | POA: Diagnosis not present

## 2021-10-12 DIAGNOSIS — M545 Low back pain, unspecified: Secondary | ICD-10-CM | POA: Diagnosis not present

## 2021-10-17 DIAGNOSIS — R531 Weakness: Secondary | ICD-10-CM | POA: Diagnosis not present

## 2021-10-17 DIAGNOSIS — M2560 Stiffness of unspecified joint, not elsewhere classified: Secondary | ICD-10-CM | POA: Diagnosis not present

## 2021-10-17 DIAGNOSIS — M79652 Pain in left thigh: Secondary | ICD-10-CM | POA: Diagnosis not present

## 2021-10-17 DIAGNOSIS — M545 Low back pain, unspecified: Secondary | ICD-10-CM | POA: Diagnosis not present

## 2021-10-19 DIAGNOSIS — M2560 Stiffness of unspecified joint, not elsewhere classified: Secondary | ICD-10-CM | POA: Diagnosis not present

## 2021-10-19 DIAGNOSIS — R531 Weakness: Secondary | ICD-10-CM | POA: Diagnosis not present

## 2021-10-19 DIAGNOSIS — M545 Low back pain, unspecified: Secondary | ICD-10-CM | POA: Diagnosis not present

## 2021-10-19 DIAGNOSIS — M79652 Pain in left thigh: Secondary | ICD-10-CM | POA: Diagnosis not present

## 2021-10-25 DIAGNOSIS — M79652 Pain in left thigh: Secondary | ICD-10-CM | POA: Diagnosis not present

## 2021-10-25 DIAGNOSIS — M2560 Stiffness of unspecified joint, not elsewhere classified: Secondary | ICD-10-CM | POA: Diagnosis not present

## 2021-10-25 DIAGNOSIS — M545 Low back pain, unspecified: Secondary | ICD-10-CM | POA: Diagnosis not present

## 2021-10-25 DIAGNOSIS — R531 Weakness: Secondary | ICD-10-CM | POA: Diagnosis not present

## 2021-10-26 DIAGNOSIS — M545 Low back pain, unspecified: Secondary | ICD-10-CM | POA: Diagnosis not present

## 2021-10-27 DIAGNOSIS — R531 Weakness: Secondary | ICD-10-CM | POA: Diagnosis not present

## 2021-10-27 DIAGNOSIS — M79652 Pain in left thigh: Secondary | ICD-10-CM | POA: Diagnosis not present

## 2021-10-27 DIAGNOSIS — M2560 Stiffness of unspecified joint, not elsewhere classified: Secondary | ICD-10-CM | POA: Diagnosis not present

## 2021-10-27 DIAGNOSIS — M545 Low back pain, unspecified: Secondary | ICD-10-CM | POA: Diagnosis not present

## 2021-11-01 DIAGNOSIS — M545 Low back pain, unspecified: Secondary | ICD-10-CM | POA: Diagnosis not present

## 2021-11-01 DIAGNOSIS — M2560 Stiffness of unspecified joint, not elsewhere classified: Secondary | ICD-10-CM | POA: Diagnosis not present

## 2021-11-01 DIAGNOSIS — M79652 Pain in left thigh: Secondary | ICD-10-CM | POA: Diagnosis not present

## 2021-11-01 DIAGNOSIS — R531 Weakness: Secondary | ICD-10-CM | POA: Diagnosis not present

## 2021-11-08 DIAGNOSIS — M545 Low back pain, unspecified: Secondary | ICD-10-CM | POA: Diagnosis not present

## 2021-11-08 DIAGNOSIS — R531 Weakness: Secondary | ICD-10-CM | POA: Diagnosis not present

## 2021-11-08 DIAGNOSIS — M79652 Pain in left thigh: Secondary | ICD-10-CM | POA: Diagnosis not present

## 2021-11-08 DIAGNOSIS — M2560 Stiffness of unspecified joint, not elsewhere classified: Secondary | ICD-10-CM | POA: Diagnosis not present

## 2021-11-14 DIAGNOSIS — M2560 Stiffness of unspecified joint, not elsewhere classified: Secondary | ICD-10-CM | POA: Diagnosis not present

## 2021-11-14 DIAGNOSIS — M79652 Pain in left thigh: Secondary | ICD-10-CM | POA: Diagnosis not present

## 2021-11-14 DIAGNOSIS — M545 Low back pain, unspecified: Secondary | ICD-10-CM | POA: Diagnosis not present

## 2021-11-14 DIAGNOSIS — R531 Weakness: Secondary | ICD-10-CM | POA: Diagnosis not present

## 2021-11-22 DIAGNOSIS — M79652 Pain in left thigh: Secondary | ICD-10-CM | POA: Diagnosis not present

## 2021-11-22 DIAGNOSIS — M545 Low back pain, unspecified: Secondary | ICD-10-CM | POA: Diagnosis not present

## 2021-11-22 DIAGNOSIS — R531 Weakness: Secondary | ICD-10-CM | POA: Diagnosis not present

## 2021-11-22 DIAGNOSIS — M2560 Stiffness of unspecified joint, not elsewhere classified: Secondary | ICD-10-CM | POA: Diagnosis not present

## 2021-12-01 DIAGNOSIS — R531 Weakness: Secondary | ICD-10-CM | POA: Diagnosis not present

## 2021-12-01 DIAGNOSIS — M545 Low back pain, unspecified: Secondary | ICD-10-CM | POA: Diagnosis not present

## 2021-12-01 DIAGNOSIS — M2560 Stiffness of unspecified joint, not elsewhere classified: Secondary | ICD-10-CM | POA: Diagnosis not present

## 2021-12-01 DIAGNOSIS — M79652 Pain in left thigh: Secondary | ICD-10-CM | POA: Diagnosis not present

## 2021-12-09 DIAGNOSIS — M79652 Pain in left thigh: Secondary | ICD-10-CM | POA: Diagnosis not present

## 2021-12-09 DIAGNOSIS — M545 Low back pain, unspecified: Secondary | ICD-10-CM | POA: Diagnosis not present

## 2021-12-09 DIAGNOSIS — R531 Weakness: Secondary | ICD-10-CM | POA: Diagnosis not present

## 2021-12-09 DIAGNOSIS — M2560 Stiffness of unspecified joint, not elsewhere classified: Secondary | ICD-10-CM | POA: Diagnosis not present

## 2021-12-21 DIAGNOSIS — Z1231 Encounter for screening mammogram for malignant neoplasm of breast: Secondary | ICD-10-CM | POA: Diagnosis not present

## 2021-12-22 ENCOUNTER — Encounter (HOSPITAL_BASED_OUTPATIENT_CLINIC_OR_DEPARTMENT_OTHER): Payer: Self-pay

## 2021-12-22 ENCOUNTER — Other Ambulatory Visit: Payer: Self-pay

## 2021-12-22 ENCOUNTER — Emergency Department (HOSPITAL_BASED_OUTPATIENT_CLINIC_OR_DEPARTMENT_OTHER)
Admission: EM | Admit: 2021-12-22 | Discharge: 2021-12-22 | Disposition: A | Discharge status: Home / Self Care | Payer: BC Managed Care – PPO | Attending: Emergency Medicine | Admitting: Emergency Medicine

## 2021-12-22 ENCOUNTER — Emergency Department (HOSPITAL_BASED_OUTPATIENT_CLINIC_OR_DEPARTMENT_OTHER): Payer: BC Managed Care – PPO

## 2021-12-22 DIAGNOSIS — M79605 Pain in left leg: Secondary | ICD-10-CM | POA: Insufficient documentation

## 2021-12-22 DIAGNOSIS — R42 Dizziness and giddiness: Secondary | ICD-10-CM | POA: Insufficient documentation

## 2021-12-22 DIAGNOSIS — R531 Weakness: Secondary | ICD-10-CM | POA: Diagnosis not present

## 2021-12-22 IMAGING — US US EXTREM LOW VENOUS*L*
1 series · 14 of 24 positions shown · non-contrast
Comparison: None Available.

CLINICAL DATA: Left leg pain

EXAM:
LEFT LOWER EXTREMITY VENOUS DOPPLER ULTRASOUND
TECHNIQUE: Gray-scale sonography with compression, as well as color and duplex
ultrasound, were performed to evaluate the deep venous system(s)
from the level of the common femoral vein through the popliteal and
proximal calf veins.

[Series 1: us extrem low venous*left* · 14 of 42 slices shown]
[im 1/42]
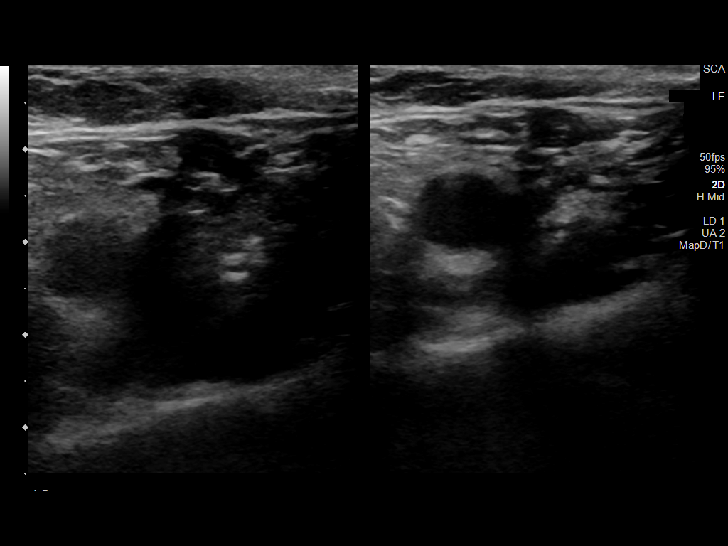
[im 4/42]
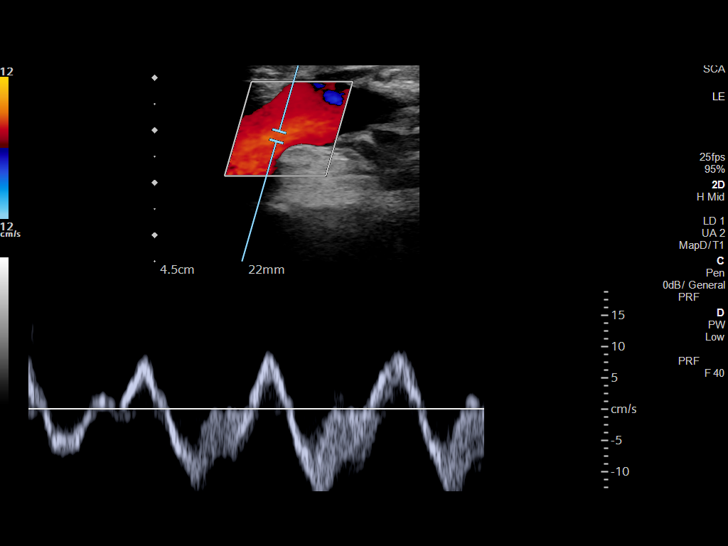
[im 8/42]
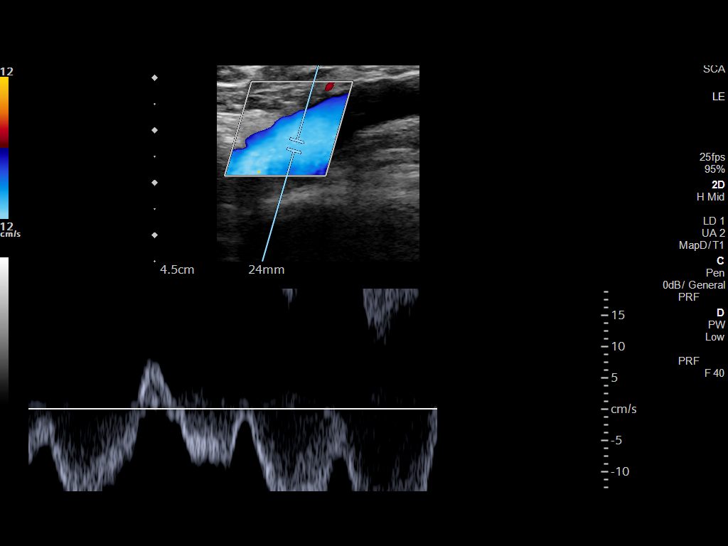
[im 11/42]
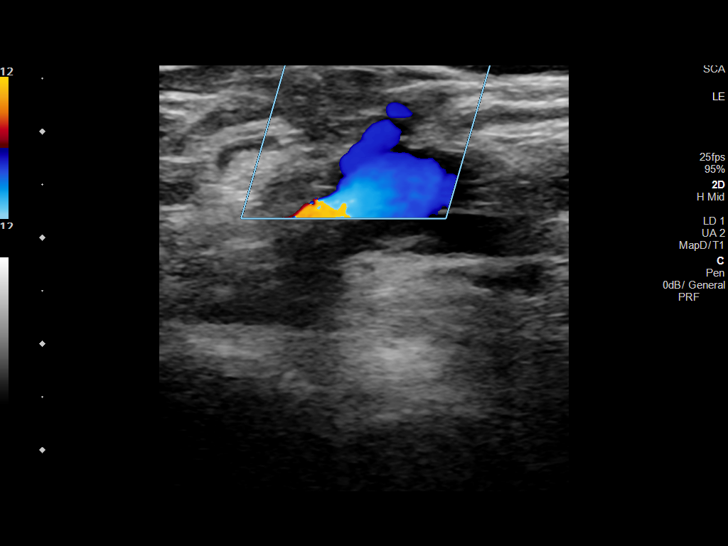
[im 13/42]
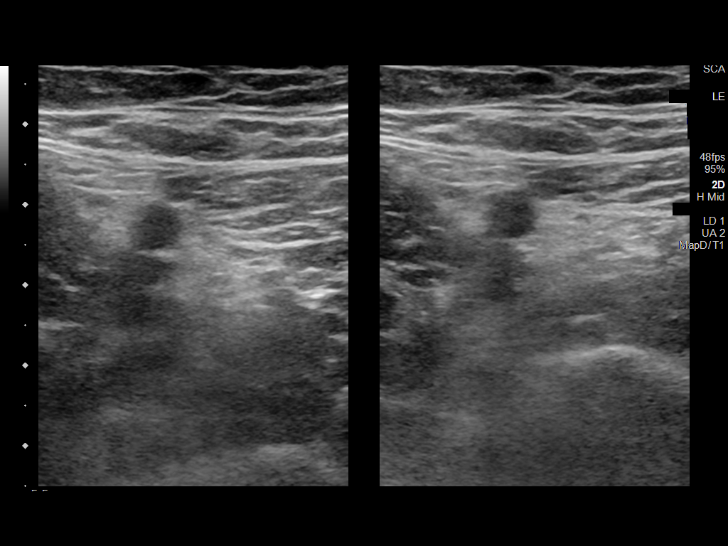
[im 17/42]
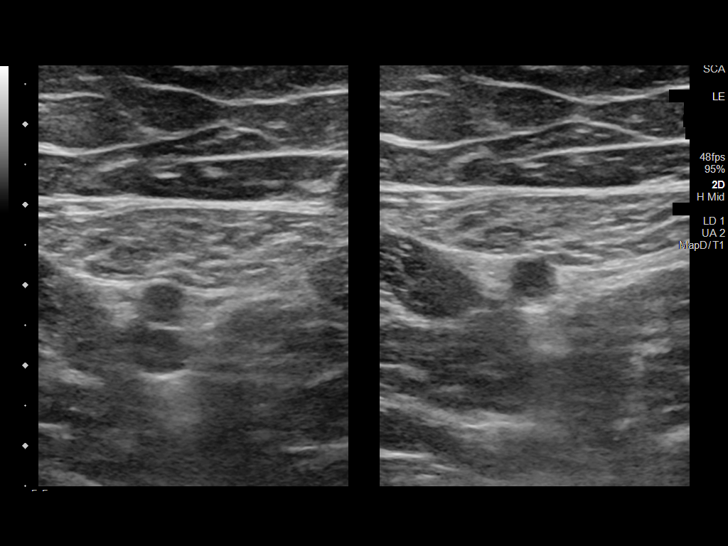
[im 20/42]
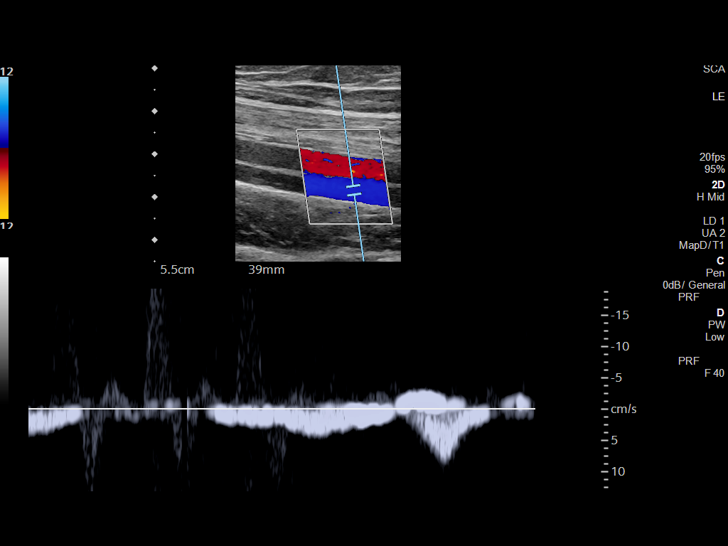
[im 22/42]
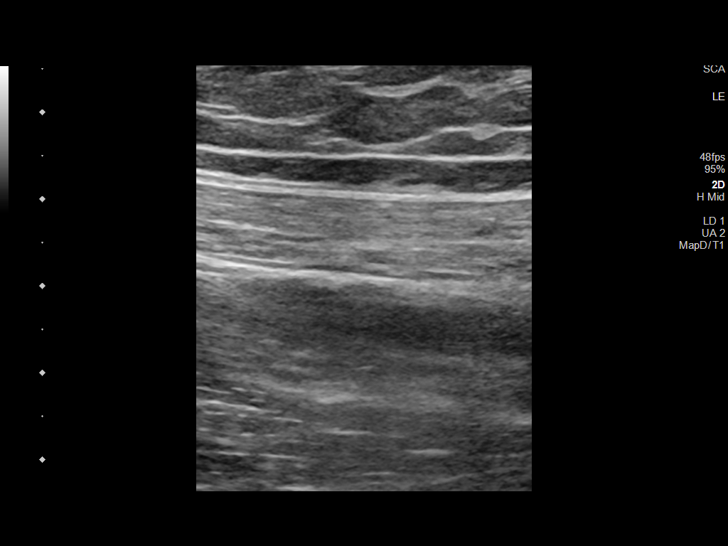
[im 25/42]
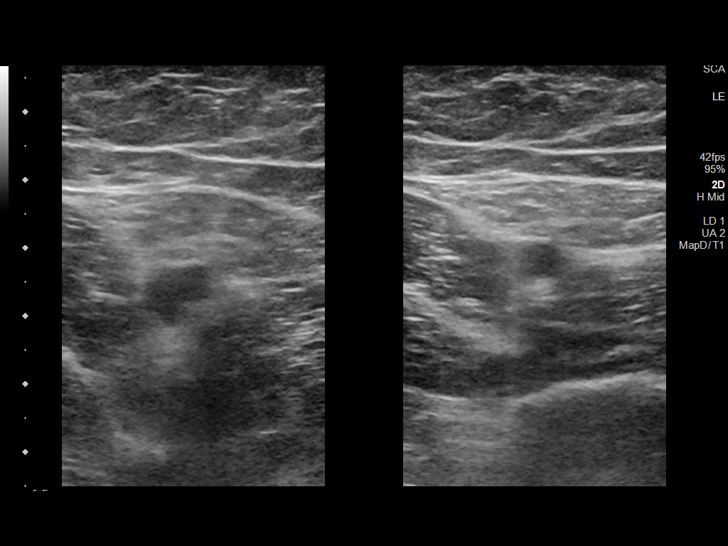
[im 29/42]
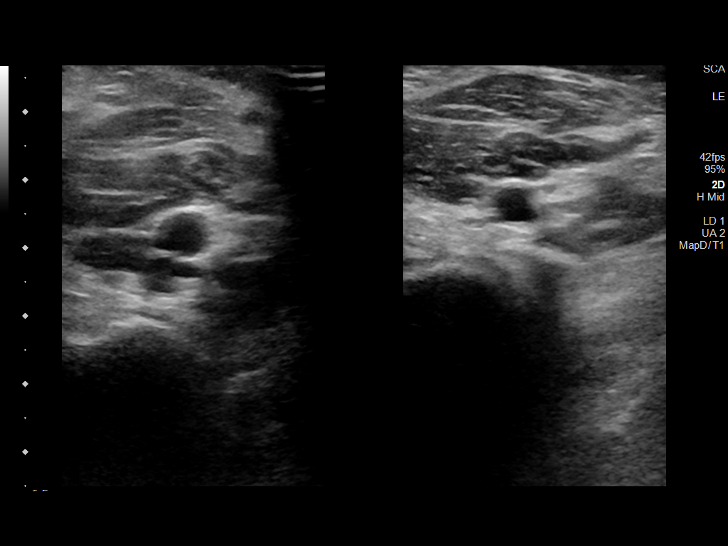
[im 33/42]
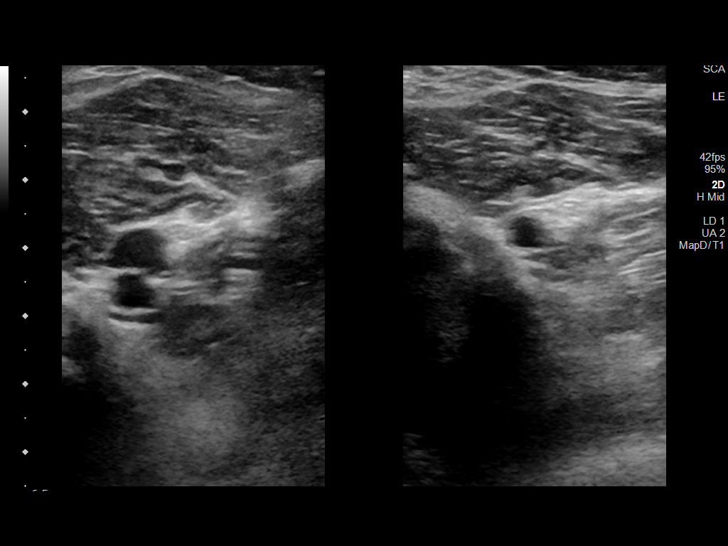
[im 34/42]
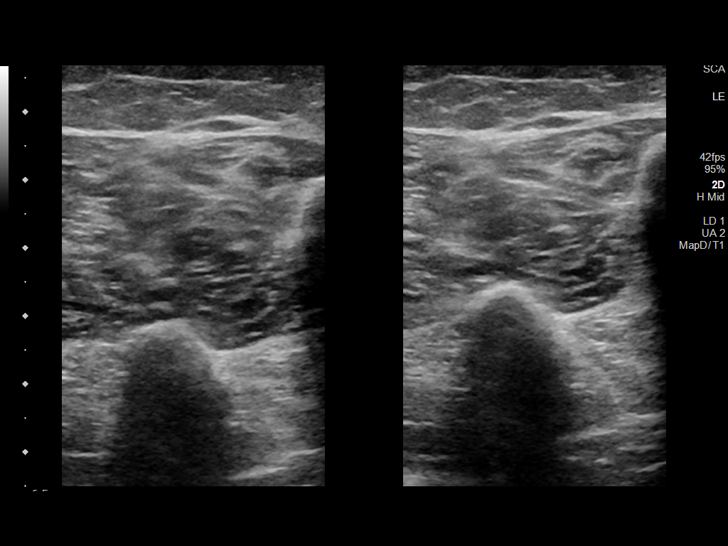
[im 38/42]
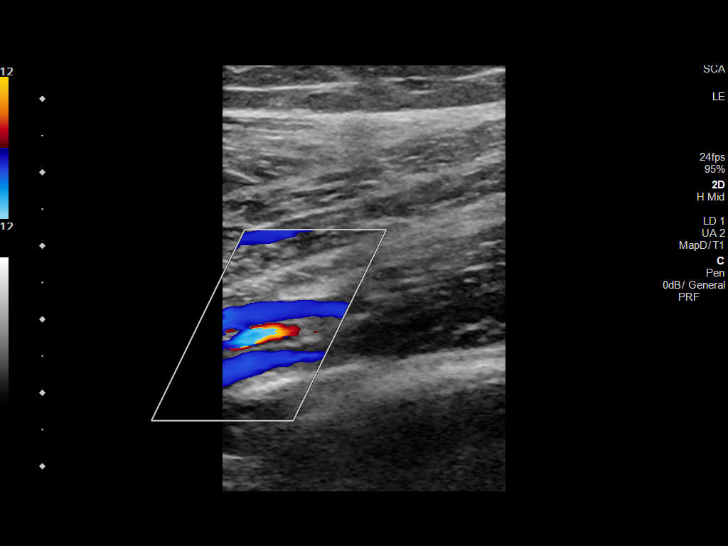
[im 42/42]
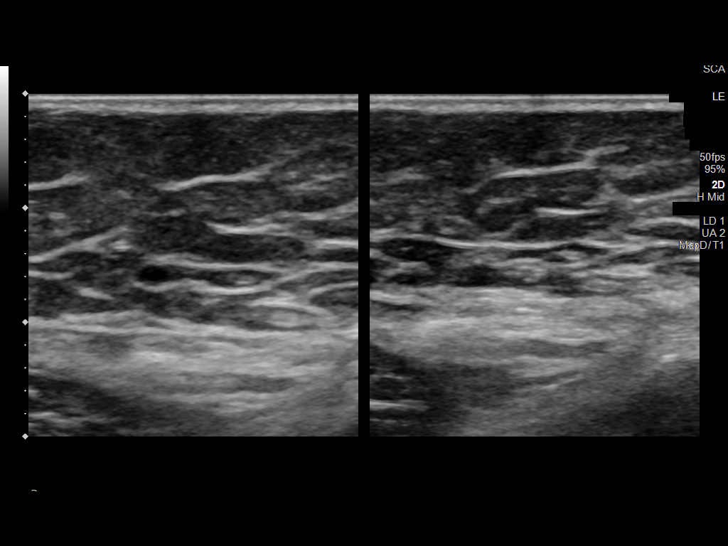

[14 of 24 positions shown; findings below may reference images not displayed]

FINDINGS: VENOUS

Normal compressibility of the common femoral, superficial femoral,
and popliteal veins, as well as the visualized calf veins.
Visualized portions of profunda femoral vein and great saphenous
vein unremarkable. No filling defects to suggest DVT on grayscale or
color Doppler imaging. Doppler waveforms show normal direction of
venous flow, normal respiratory plasticity and response to
augmentation.

Limited views of the contralateral common femoral vein are
unremarkable.

OTHER

None.

Limitations: none
IMPRESSION: Negative.

## 2021-12-22 NOTE — ED Notes (Signed)
Pain in left leg .States that she felt a knot in her leg. Denies any swelling. States that she does have numbness and tingling.Has a dizzy spell this weekend. ?

## 2021-12-22 NOTE — ED Notes (Signed)
In to evaluate client, no resp distress noted at this time, no complaints of resp issues or chest pain from client. Appears comfortable. Prepared for US Venous of lower extremity, comfort measures provided, call bell system within reach, updated on current plan of care.  ?

## 2021-12-22 NOTE — ED Triage Notes (Signed)
Patient states over the last several days she has had a lump starting in her calf and traveling up to the back of her knee and now to her lower thigh.  ?Patient states she has had increased shortness of breath with exertion.  ?

## 2021-12-22 NOTE — Discharge Instructions (Addendum)
Use ibuprofen and Tylenol for any pain.  Continue going to physical therapy and talk to them about these sore muscles.  Also keep your primary care provider in the loop and see them for any further concerns and the results of your imaging. ?
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
## 2021-12-26 DIAGNOSIS — R42 Dizziness and giddiness: Secondary | ICD-10-CM | POA: Diagnosis not present

## 2021-12-27 DIAGNOSIS — M2560 Stiffness of unspecified joint, not elsewhere classified: Secondary | ICD-10-CM | POA: Diagnosis not present

## 2021-12-27 DIAGNOSIS — M545 Low back pain, unspecified: Secondary | ICD-10-CM | POA: Diagnosis not present

## 2021-12-27 DIAGNOSIS — R531 Weakness: Secondary | ICD-10-CM | POA: Diagnosis not present

## 2021-12-27 DIAGNOSIS — M79652 Pain in left thigh: Secondary | ICD-10-CM | POA: Diagnosis not present

## 2022-01-05 DIAGNOSIS — M2560 Stiffness of unspecified joint, not elsewhere classified: Secondary | ICD-10-CM | POA: Diagnosis not present

## 2022-01-05 DIAGNOSIS — M545 Low back pain, unspecified: Secondary | ICD-10-CM | POA: Diagnosis not present

## 2022-01-05 DIAGNOSIS — R531 Weakness: Secondary | ICD-10-CM | POA: Diagnosis not present

## 2022-01-05 DIAGNOSIS — M79652 Pain in left thigh: Secondary | ICD-10-CM | POA: Diagnosis not present

## 2022-01-12 DIAGNOSIS — M545 Low back pain, unspecified: Secondary | ICD-10-CM | POA: Diagnosis not present

## 2022-01-12 DIAGNOSIS — R531 Weakness: Secondary | ICD-10-CM | POA: Diagnosis not present

## 2022-01-12 DIAGNOSIS — M2560 Stiffness of unspecified joint, not elsewhere classified: Secondary | ICD-10-CM | POA: Diagnosis not present

## 2022-01-12 DIAGNOSIS — M79652 Pain in left thigh: Secondary | ICD-10-CM | POA: Diagnosis not present

## 2022-01-20 DIAGNOSIS — M2560 Stiffness of unspecified joint, not elsewhere classified: Secondary | ICD-10-CM | POA: Diagnosis not present

## 2022-01-20 DIAGNOSIS — M545 Low back pain, unspecified: Secondary | ICD-10-CM | POA: Diagnosis not present

## 2022-01-20 DIAGNOSIS — M79652 Pain in left thigh: Secondary | ICD-10-CM | POA: Diagnosis not present

## 2022-01-20 DIAGNOSIS — R531 Weakness: Secondary | ICD-10-CM | POA: Diagnosis not present

## 2022-01-27 DIAGNOSIS — M545 Low back pain, unspecified: Secondary | ICD-10-CM | POA: Diagnosis not present

## 2022-01-27 DIAGNOSIS — R531 Weakness: Secondary | ICD-10-CM | POA: Diagnosis not present

## 2022-01-27 DIAGNOSIS — M79652 Pain in left thigh: Secondary | ICD-10-CM | POA: Diagnosis not present

## 2022-01-27 DIAGNOSIS — M2560 Stiffness of unspecified joint, not elsewhere classified: Secondary | ICD-10-CM | POA: Diagnosis not present

## 2022-02-10 DIAGNOSIS — M2560 Stiffness of unspecified joint, not elsewhere classified: Secondary | ICD-10-CM | POA: Diagnosis not present

## 2022-02-10 DIAGNOSIS — M79652 Pain in left thigh: Secondary | ICD-10-CM | POA: Diagnosis not present

## 2022-02-10 DIAGNOSIS — M545 Low back pain, unspecified: Secondary | ICD-10-CM | POA: Diagnosis not present

## 2022-02-10 DIAGNOSIS — R531 Weakness: Secondary | ICD-10-CM | POA: Diagnosis not present

## 2022-02-15 DIAGNOSIS — M2560 Stiffness of unspecified joint, not elsewhere classified: Secondary | ICD-10-CM | POA: Diagnosis not present

## 2022-02-15 DIAGNOSIS — M545 Low back pain, unspecified: Secondary | ICD-10-CM | POA: Diagnosis not present

## 2022-02-15 DIAGNOSIS — R531 Weakness: Secondary | ICD-10-CM | POA: Diagnosis not present

## 2022-02-15 DIAGNOSIS — M79652 Pain in left thigh: Secondary | ICD-10-CM | POA: Diagnosis not present

## 2022-03-01 DIAGNOSIS — M545 Low back pain, unspecified: Secondary | ICD-10-CM | POA: Diagnosis not present

## 2022-03-01 DIAGNOSIS — R531 Weakness: Secondary | ICD-10-CM | POA: Diagnosis not present

## 2022-03-01 DIAGNOSIS — M79652 Pain in left thigh: Secondary | ICD-10-CM | POA: Diagnosis not present

## 2022-03-01 DIAGNOSIS — M2560 Stiffness of unspecified joint, not elsewhere classified: Secondary | ICD-10-CM | POA: Diagnosis not present

## 2022-03-10 DIAGNOSIS — M545 Low back pain, unspecified: Secondary | ICD-10-CM | POA: Diagnosis not present

## 2022-03-10 DIAGNOSIS — M79652 Pain in left thigh: Secondary | ICD-10-CM | POA: Diagnosis not present

## 2022-03-10 DIAGNOSIS — R531 Weakness: Secondary | ICD-10-CM | POA: Diagnosis not present

## 2022-03-10 DIAGNOSIS — M2560 Stiffness of unspecified joint, not elsewhere classified: Secondary | ICD-10-CM | POA: Diagnosis not present

## 2022-03-16 DIAGNOSIS — H903 Sensorineural hearing loss, bilateral: Secondary | ICD-10-CM | POA: Diagnosis not present

## 2022-03-16 DIAGNOSIS — G43809 Other migraine, not intractable, without status migrainosus: Secondary | ICD-10-CM | POA: Diagnosis not present

## 2022-03-16 DIAGNOSIS — H938X2 Other specified disorders of left ear: Secondary | ICD-10-CM | POA: Diagnosis not present

## 2022-03-16 DIAGNOSIS — H9312 Tinnitus, left ear: Secondary | ICD-10-CM | POA: Diagnosis not present

## 2022-03-16 DIAGNOSIS — H6122 Impacted cerumen, left ear: Secondary | ICD-10-CM | POA: Diagnosis not present

## 2022-03-20 DIAGNOSIS — M2560 Stiffness of unspecified joint, not elsewhere classified: Secondary | ICD-10-CM | POA: Diagnosis not present

## 2022-03-20 DIAGNOSIS — M79652 Pain in left thigh: Secondary | ICD-10-CM | POA: Diagnosis not present

## 2022-03-20 DIAGNOSIS — M545 Low back pain, unspecified: Secondary | ICD-10-CM | POA: Diagnosis not present

## 2022-03-20 DIAGNOSIS — R531 Weakness: Secondary | ICD-10-CM | POA: Diagnosis not present

## 2022-04-04 DIAGNOSIS — M545 Low back pain, unspecified: Secondary | ICD-10-CM | POA: Diagnosis not present

## 2022-04-04 DIAGNOSIS — M79652 Pain in left thigh: Secondary | ICD-10-CM | POA: Diagnosis not present

## 2022-04-04 DIAGNOSIS — R531 Weakness: Secondary | ICD-10-CM | POA: Diagnosis not present

## 2022-04-04 DIAGNOSIS — M2560 Stiffness of unspecified joint, not elsewhere classified: Secondary | ICD-10-CM | POA: Diagnosis not present

## 2022-07-05 DIAGNOSIS — N760 Acute vaginitis: Secondary | ICD-10-CM | POA: Diagnosis not present

## 2022-07-05 DIAGNOSIS — Z6825 Body mass index (BMI) 25.0-25.9, adult: Secondary | ICD-10-CM | POA: Diagnosis not present

## 2022-07-05 DIAGNOSIS — Z01419 Encounter for gynecological examination (general) (routine) without abnormal findings: Secondary | ICD-10-CM | POA: Diagnosis not present

## 2022-07-20 DIAGNOSIS — N6324 Unspecified lump in the left breast, lower inner quadrant: Secondary | ICD-10-CM | POA: Diagnosis not present

## 2022-07-20 DIAGNOSIS — R92343 Mammographic extreme density, bilateral breasts: Secondary | ICD-10-CM | POA: Diagnosis not present

## 2022-07-26 ENCOUNTER — Other Ambulatory Visit: Payer: Self-pay

## 2022-07-26 DIAGNOSIS — N6012 Diffuse cystic mastopathy of left breast: Secondary | ICD-10-CM | POA: Diagnosis not present

## 2022-07-26 DIAGNOSIS — N6324 Unspecified lump in the left breast, lower inner quadrant: Secondary | ICD-10-CM | POA: Diagnosis not present

## 2022-11-15 ENCOUNTER — Ambulatory Visit: Admitting: Diagnostic Neuroimaging

## 2022-11-15 ENCOUNTER — Telehealth: Payer: Self-pay | Admitting: Diagnostic Neuroimaging

## 2022-11-15 ENCOUNTER — Encounter: Payer: Self-pay | Admitting: Diagnostic Neuroimaging

## 2022-11-15 VITALS — BP 138/82 | HR 89 | Ht 63.0 in | Wt 143.2 lb

## 2022-11-15 DIAGNOSIS — R519 Headache, unspecified: Secondary | ICD-10-CM | POA: Diagnosis not present

## 2022-11-15 MED ORDER — GABAPENTIN 100 MG PO CAPS
100.0000 mg | ORAL_CAPSULE | Freq: Every day | ORAL | 3 refills | Status: AC
Start: 2022-11-15 — End: ?

## 2022-11-15 NOTE — Telephone Encounter (Signed)
[employer] NPR sent to GI [phone] 
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
## 2022-11-15 NOTE — Patient Instructions (Signed)
WORSENING HEADACHES / LEFT PARIETAL SCALP CYST (?lipoma) - check CT head (rule out bleeding)  HISTORY OF MIGRAINE / OCCIPITAL NEURALGIA - trial of gabapentin 100-300mg  at bedtime (has helped in the past)

## 2022-12-21 ENCOUNTER — Ambulatory Visit
Admission: RE | Admit: 2022-12-21 | Discharge: 2022-12-21 | Disposition: A | Discharge status: Home / Self Care | Source: Ambulatory Visit | Attending: Diagnostic Neuroimaging | Admitting: Diagnostic Neuroimaging

## 2022-12-21 DIAGNOSIS — R519 Headache, unspecified: Secondary | ICD-10-CM

## 2023-05-21 ENCOUNTER — Telehealth: Admitting: Diagnostic Neuroimaging

## 2023-07-25 ENCOUNTER — Other Ambulatory Visit: Payer: Self-pay | Admitting: Obstetrics and Gynecology

## 2023-07-25 DIAGNOSIS — Z9189 Other specified personal risk factors, not elsewhere classified: Secondary | ICD-10-CM

## 2023-08-29 ENCOUNTER — Encounter: Payer: Self-pay | Admitting: Obstetrics and Gynecology

## 2023-09-02 ENCOUNTER — Ambulatory Visit
Admission: RE | Admit: 2023-09-02 | Discharge: 2023-09-02 | Disposition: A | Discharge status: Home / Self Care | Source: Ambulatory Visit | Attending: Obstetrics and Gynecology | Admitting: Obstetrics and Gynecology

## 2023-09-02 DIAGNOSIS — Z9189 Other specified personal risk factors, not elsewhere classified: Secondary | ICD-10-CM

## 2023-09-02 MED ORDER — GADOPICLENOL 0.5 MMOL/ML IV SOLN
7.0000 mL | Freq: Once | INTRAVENOUS | Status: AC | PRN
  Administered 2023-09-02: 7 mL via INTRAVENOUS

## 2023-09-03 ENCOUNTER — Other Ambulatory Visit: Payer: Self-pay | Admitting: Obstetrics and Gynecology

## 2023-09-03 DIAGNOSIS — N6310 Unspecified lump in the right breast, unspecified quadrant: Secondary | ICD-10-CM

## 2023-09-04 ENCOUNTER — Other Ambulatory Visit: Payer: Self-pay | Admitting: Obstetrics and Gynecology

## 2023-09-04 DIAGNOSIS — N6310 Unspecified lump in the right breast, unspecified quadrant: Secondary | ICD-10-CM

## 2023-09-12 ENCOUNTER — Other Ambulatory Visit

## 2023-09-17 ENCOUNTER — Ambulatory Visit
Admission: RE | Admit: 2023-09-17 | Discharge: 2023-09-17 | Disposition: A | Discharge status: Home / Self Care | Source: Ambulatory Visit | Attending: Obstetrics and Gynecology | Admitting: Obstetrics and Gynecology

## 2023-09-17 ENCOUNTER — Other Ambulatory Visit: Payer: Self-pay | Admitting: Obstetrics and Gynecology

## 2023-09-17 DIAGNOSIS — N6310 Unspecified lump in the right breast, unspecified quadrant: Secondary | ICD-10-CM

## 2023-09-17 HISTORY — PX: BREAST BIOPSY: SHX20

## 2023-09-18 LAB — SURGICAL PATHOLOGY

## 2024-09-06 ENCOUNTER — Other Ambulatory Visit: Payer: Self-pay | Admitting: Diagnostic Neuroimaging
